# Patient Record
Sex: Male | Born: 1980
Health system: Southern US, Community
[De-identification: ages and names within clinical notes are randomized; demographics above are authoritative.]

## PROBLEM LIST (undated history)

## (undated) DIAGNOSIS — E119 Type 2 diabetes mellitus without complications: Secondary | ICD-10-CM

## (undated) DIAGNOSIS — I10 Essential (primary) hypertension: Secondary | ICD-10-CM

---

## 2009-01-10 ENCOUNTER — Inpatient Hospital Stay (HOSPITAL_COMMUNITY): Admission: EM | Admit: 2009-01-10 | Discharge: 2009-01-12 | Payer: Self-pay | Admitting: Emergency Medicine

## 2010-12-16 LAB — CBC
HCT: 41.3 % (ref 39.0–52.0)
Hemoglobin: 15.2 g/dL (ref 13.0–17.0)
MCHC: 35.1 g/dL (ref 30.0–36.0)
MCV: 81.5 fL (ref 78.0–100.0)
Platelets: 266 10*3/uL (ref 150–400)
RBC: 5.07 MIL/uL (ref 4.22–5.81)
RDW: 13.3 % (ref 11.5–15.5)
WBC: 6 10*3/uL (ref 4.0–10.5)

## 2010-12-16 LAB — GLUCOSE, CAPILLARY
Glucose-Capillary: 197 mg/dL — ABNORMAL HIGH (ref 70–99)
Glucose-Capillary: 230 mg/dL — ABNORMAL HIGH (ref 70–99)
Glucose-Capillary: 292 mg/dL — ABNORMAL HIGH (ref 70–99)
Glucose-Capillary: 297 mg/dL — ABNORMAL HIGH (ref 70–99)
Glucose-Capillary: 318 mg/dL — ABNORMAL HIGH (ref 70–99)
Glucose-Capillary: 325 mg/dL — ABNORMAL HIGH (ref 70–99)
Glucose-Capillary: 331 mg/dL — ABNORMAL HIGH (ref 70–99)
Glucose-Capillary: 340 mg/dL — ABNORMAL HIGH (ref 70–99)
Glucose-Capillary: 362 mg/dL — ABNORMAL HIGH (ref 70–99)
Glucose-Capillary: 377 mg/dL — ABNORMAL HIGH (ref 70–99)
Glucose-Capillary: 419 mg/dL — ABNORMAL HIGH (ref 70–99)
Glucose-Capillary: 509 mg/dL (ref 70–99)
Glucose-Capillary: 531 mg/dL (ref 70–99)

## 2010-12-16 LAB — BASIC METABOLIC PANEL
BUN: 8 mg/dL (ref 6–23)
CO2: 21 mEq/L (ref 19–32)
Calcium: 8.6 mg/dL (ref 8.4–10.5)
Chloride: 100 mEq/L (ref 96–112)
Creatinine, Ser: 0.84 mg/dL (ref 0.4–1.5)
Creatinine, Ser: 1 mg/dL (ref 0.4–1.5)
GFR calc Af Amer: >60 mL/min (ref >60)
GFR calc non Af Amer: >60 mL/min (ref >60)
Glucose, Bld: 324 mg/dL — ABNORMAL HIGH (ref 70–99)
Potassium: 3.8 mEq/L (ref 3.5–5.1)
Potassium: 3.8 mEq/L (ref 3.5–5.1)

## 2010-12-16 LAB — URINALYSIS, ROUTINE W REFLEX MICROSCOPIC
Leukocytes, UA: NEGATIVE
Nitrite: NEGATIVE
Specific Gravity, Urine: 1.034 — ABNORMAL HIGH (ref 1.005–1.030)
Urobilinogen, UA: 0.2 mg/dL (ref 0.0–1.0)
pH: 5.5 (ref 5.0–8.0)

## 2010-12-16 LAB — KETONES, QUALITATIVE

## 2010-12-16 LAB — DIFFERENTIAL
Basophils Absolute: 0 10*3/uL (ref 0.0–0.1)
Lymphocytes Relative: 22 % (ref 12–46)
Neutro Abs: 5.5 10*3/uL (ref 1.7–7.7)
Neutrophils Relative %: 70 % (ref 43–77)

## 2010-12-16 LAB — HEMOGLOBIN A1C: Hgb A1c MFr Bld: 10.9 % — ABNORMAL HIGH (ref 4.6–6.1)

## 2010-12-16 LAB — POCT I-STAT, CHEM 8
Glucose, Bld: 606 mg/dL (ref 70–99)
HCT: 47 % (ref 39.0–52.0)
Hemoglobin: 16 g/dL (ref 13.0–17.0)
Potassium: 4.1 mEq/L (ref 3.5–5.1)
Sodium: 127 mEq/L — ABNORMAL LOW (ref 135–145)
TCO2: 16 mmol/L (ref 0–100)

## 2010-12-16 LAB — PHOSPHORUS: Phosphorus: 3.3 mg/dL (ref 2.3–4.6)

## 2011-01-20 NOTE — H&P (Signed)
NAMEJUELZ, [name] NO.:  [id number]   MEDICAL RECORD NO.:  [id number]          PATIENT TYPE:  EMS   LOCATION:  ED                           FACILITY:  [facility]   PHYSICIAN:  Pedro Earls, MD     DATE OF BIRTH:  April 15, 1981   DATE OF ADMISSION:  01/09/2009  DATE OF DISCHARGE:                              HISTORY & PHYSICAL   CHIEF COMPLAINT:  Dizziness and polyuria.   HISTORY OF PRESENT ILLNESS:  This is [name]  patient with no significant past medical history who was presented with  11-month history of dizziness.  According to the patient, he had been  getting dizzy whenever he eats anything greasy and dizzy spells come any  time during the day.  The patient had also been eating more and had been  drinking a lot of water and had been urinating every 5 to 10 minutes.  The patient is complaining of some weakness.  Denies any nausea, any  fevers, any chills, any shortness of breath, or chest pain.   REVIEW OF SYSTEMS:  As above.  Rest of the review of systems is  negative.   PAST MEDICAL HISTORY:  None.   PAST SURGICAL HISTORY:  None.   MEDICATIONS:  None.   SOCIAL HISTORY:  Negative for smoking, alcohol, and IV drug abuse.   FAMILY HISTORY:  Positive for hypertension.   PHYSICAL EXAMINATION:  VITAL SIGNS:  Temperature 98.1, blood pressure  141/107, pulse 119 to 124, respirations 22, and pulse ox 98% on room  air.  GENERAL:  The patient is awake, alert, oriented x3.  HEENT:  Pupils are equal, round, and reactive to light.  No icterus.  No  pallor.  Extraocular movements are intact.  Oral mucosa is dry.  NECK:  Supple.  No JVD.  No lymphadenopathy.  CVS:  S1 and S2.  Regular to sinus tach.  No murmurs, heaves, or  gallops.  CHEST:  Clear.  ABDOMEN:  Soft, obese, and nontender.  Bowel sounds are present.  No  hepatosplenomegaly.  EXTREMITIES:  Peripheral pulses are present.  No clubbing, cyanosis, or  edema.  CNS:  Sensory  and motor grossly.  Cranial nerves II through XII are  intact.  SKIN:  No rashes.  MUSCULOSKELETAL:  Unremarkable.   LABORATORY DATA:  Sodium 127, creatinine 1.03, and blood sugar 606.  CBC  within normal range.  UA within normal range.   IMPRESSION:  1. New-onset diabetes.  2. Obesity.  3. Dehydration.  4. Hyponatremia.  5. Sinus tachycardia.  6. Hypertension.   PLAN:  Admit to med-surge.  Start insulin sliding scale coverage.  Start  metformin 500 mg b.i.d.  IV fluids with 150 mL per hour.  Diabetes  education.  Start lisinopril 2.5 mg daily for the patient's blood  pressure.      Pedro Earls, MD  Electronically Signed     NS/MEDQ  D:  01/10/2009  T:  01/10/2009  Job:  [id number]

## 2011-01-20 NOTE — Discharge Summary (Signed)
[name], [name] NO.:  [id number]   MEDICAL RECORD NO.:  [id number]          PATIENT TYPE:  INP   LOCATION:  1530                         FACILITY:  [facility]   PHYSICIAN:  Hillery Aldo, M.D.   DATE OF BIRTH:  March 15, 1981   DATE OF ADMISSION:  01/09/2009  DATE OF DISCHARGE:  01/12/2009                               DISCHARGE SUMMARY   PRIMARY CARE PHYSICIAN:  None.  The patient will be referred to Dr.  Dorothyann Nichols for hospital follow-up and primary care.   DISCHARGE DIAGNOSES:  1. Newly-diagnosed uncontrolled type 2 diabetes.  2. Hyponatremia.  3. Obesity.  4. Hypertension.   DISCHARGE MEDICATIONS:  1. Lisinopril 10 mg p.o. daily.  2. Lantus insulin 40 units subcutaneously q.h.s.  3. NovoLog insulin 8 units subcutaneously q.a.c.  4. Insulin resistant sliding scale q.a.c. and q.h.s.  5. Glucotrol XL 10 mg p.o. daily.  6. Metformin 1000 mg p.o. b.i.d.   CONSULTATIONS:  None.   HISTORY OF PRESENT ILLNESS:  The patient is a 30 year old obese male,  with no significant past medical history, who presented to the hospital  with a chief complaint of dizziness.  The patient reported progressive  polyuria and polydipsia, to the point where he has been urinating every  5-10 minutes.  He also felt weak.  On initial evaluation in the  emergency department, he was found to have a marked elevation in his  blood glucose and was admitted for further evaluation and workup.  For  full details, please see the dictated report done by Dr. Gratiot Nichols.   PROCEDURES/DIAGNOSTIC STUDIES:  None.   DISCHARGE LABORATORY VALUES:  Sodium was 131, potassium 3.8, chloride  102, bicarb 20, BUN 8, creatinine 0.84, glucose 324, calcium 8.6.  Hemoglobin A1c was 10.9%.  Phosphorus was 3.3, magnesium was 2.0.  White  blood cell count was 6.0, hemoglobin 14.5, hematocrit 41.3, platelets  240.   HOSPITAL COURSE:  Problem #1 -  New onset of uncontrolled type 2  diabetes:  The patient was  admitted and found to have mild serum ketosis  without evidence of metabolic acidosis.  He was felt to be in early  diabetic ketoacidosis and was vigorously hydrated and put on a  combination of oral hypoglycemics as well as sliding scale insulin.  He  was seen in consultation with the dietician and diabetes coordinators,  who educated him extensively on his disease.  His anti-diabetic regimen  was titrated up for better glycemic control.  His hemoglobin A1c was  found to be markedly elevated at 10.9%, corresponding to a mean plasma  glucose of 266.  At this time, his glycemic control is improved but  still suboptimal.  He will be discharged on the regimen as outlined  above, and has been instructed to follow up with Dr. Allyne Nichols within one  week, and to keep track of his blood glucoses so that she can further  adjust his medications.  He will also be set up for outpatient diabetes  education.  Problem #2 -  Hyponatremia:  This is felt to be artifactual, secondary  to hyperglycemia.  Problem #3 -  Obesity:  The patient was counseled on the importance of  weight loss and put on a carbohydrate modified diet.  Problem #4 -  Hypertension:  The patient's blood pressure was controlled  with up-titration of lisinopril.  Discharge blood pressure is 107/68.   DISPOSITION:  The patient is medically stable for discharge.  He has  been instructed fully on how to check his blood glucoses and how to self-  administer insulin according to a sliding scale.  The patient will  follow up with Dr. Allyne Nichols and at the outpatient [facility].   Time spent coordinating care for discharge and discharge instructions  equals 35 minutes.      Hillery Aldo, M.D.  Electronically Signed     CR/MEDQ  D:  01/12/2009  T:  01/12/2009  Job:  [id number]   cc:   Candyce Churn. [name], M.D.  Fax: [phone]

## 2011-04-17 ENCOUNTER — Inpatient Hospital Stay (HOSPITAL_COMMUNITY)
Admission: EM | Admit: 2011-04-17 | Discharge: 2011-04-20 | DRG: 639 | Disposition: A | Discharge status: Home / Self Care | Payer: Self-pay | Attending: Internal Medicine | Admitting: Internal Medicine

## 2011-04-17 DIAGNOSIS — E1310 Other specified diabetes mellitus with ketoacidosis without coma: Principal | ICD-10-CM | POA: Diagnosis present

## 2011-04-17 DIAGNOSIS — E876 Hypokalemia: Secondary | ICD-10-CM | POA: Diagnosis not present

## 2011-04-17 DIAGNOSIS — IMO0002 Reserved for concepts with insufficient information to code with codable children: Secondary | ICD-10-CM | POA: Diagnosis present

## 2011-04-17 DIAGNOSIS — R030 Elevated blood-pressure reading, without diagnosis of hypertension: Secondary | ICD-10-CM | POA: Diagnosis present

## 2011-04-17 DIAGNOSIS — R000 Tachycardia, unspecified: Secondary | ICD-10-CM | POA: Diagnosis present

## 2011-04-17 DIAGNOSIS — K036 Deposits [accretions] on teeth: Secondary | ICD-10-CM | POA: Diagnosis present

## 2011-04-17 DIAGNOSIS — K045 Chronic apical periodontitis: Secondary | ICD-10-CM | POA: Diagnosis present

## 2011-04-17 DIAGNOSIS — K047 Periapical abscess without sinus: Secondary | ICD-10-CM | POA: Diagnosis present

## 2011-04-17 DIAGNOSIS — K029 Dental caries, unspecified: Secondary | ICD-10-CM | POA: Diagnosis present

## 2011-04-17 LAB — DIFFERENTIAL
Eosinophils Absolute: 0.1 10*3/uL (ref 0.0–0.7)
Eosinophils Relative: 1 % (ref 0–5)
Lymphocytes Relative: 28 % (ref 12–46)
Lymphs Abs: 2 10*3/uL (ref 0.7–4.0)
Monocytes Absolute: 0.5 10*3/uL (ref 0.1–1.0)

## 2011-04-17 LAB — POCT I-STAT, CHEM 8
Calcium, Ion: 1.24 mmol/L (ref 1.12–1.32)
Chloride: 106 mEq/L (ref 96–112)
Creatinine, Ser: 1 mg/dL (ref 0.50–1.35)
Glucose, Bld: 480 mg/dL — ABNORMAL HIGH (ref 70–99)
Hemoglobin: 16 g/dL (ref 13.0–17.0)
Potassium: 4.6 mEq/L (ref 3.5–5.1)

## 2011-04-17 LAB — POCT I-STAT 3, ART BLOOD GAS (G3+)
Acid-base deficit: 13 mmol/L — ABNORMAL HIGH (ref 0.0–2.0)
O2 Saturation: 98 %
TCO2: 11 mmol/L (ref 0–100)
pCO2 arterial: 20.7 mmHg — ABNORMAL LOW (ref 35.0–45.0)

## 2011-04-17 LAB — CBC
HCT: 43.2 % (ref 39.0–52.0)
MCHC: 35.6 g/dL (ref 30.0–36.0)
MCV: 77.4 fL — ABNORMAL LOW (ref 78.0–100.0)
Platelets: 233 10*3/uL (ref 150–400)
RDW: 13 % (ref 11.5–15.5)

## 2011-04-18 ENCOUNTER — Inpatient Hospital Stay (HOSPITAL_COMMUNITY): Payer: Self-pay

## 2011-04-18 LAB — DIFFERENTIAL
Eosinophils Absolute: 0.1 10*3/uL (ref 0.0–0.7)
Eosinophils Relative: 2 % (ref 0–5)
Lymphs Abs: 1.6 10*3/uL (ref 0.7–4.0)
Monocytes Absolute: 0.5 10*3/uL (ref 0.1–1.0)

## 2011-04-18 LAB — BASIC METABOLIC PANEL
Calcium: 8.4 mg/dL (ref 8.4–10.5)
Chloride: 101 mEq/L (ref 96–112)
Creatinine, Ser: 0.89 mg/dL (ref 0.50–1.35)
GFR calc Af Amer: >60 mL/min (ref >60)
GFR calc non Af Amer: >60 mL/min (ref >60)
Glucose, Bld: 317 mg/dL — ABNORMAL HIGH (ref 70–99)
Potassium: 3.7 mEq/L (ref 3.5–5.1)
Sodium: 135 mEq/L (ref 135–145)
Sodium: 130 mEq/L — ABNORMAL LOW (ref 135–145)

## 2011-04-18 LAB — PHOSPHORUS
Phosphorus: 2.2 mg/dL — ABNORMAL LOW (ref 2.3–4.6)
Phosphorus: 2.1 mg/dL — ABNORMAL LOW (ref 2.3–4.6)

## 2011-04-18 LAB — GLUCOSE, CAPILLARY
Glucose-Capillary: 361 mg/dL — ABNORMAL HIGH (ref 70–99)
Glucose-Capillary: 295 mg/dL — ABNORMAL HIGH (ref 70–99)
Glucose-Capillary: 205 mg/dL — ABNORMAL HIGH (ref 70–99)
Glucose-Capillary: 248 mg/dL — ABNORMAL HIGH (ref 70–99)
Glucose-Capillary: 318 mg/dL — ABNORMAL HIGH (ref 70–99)
Glucose-Capillary: 228 mg/dL — ABNORMAL HIGH (ref 70–99)

## 2011-04-18 LAB — CBC
HCT: 36.8 % — ABNORMAL LOW (ref 39.0–52.0)
MCHC: 36.1 g/dL — ABNORMAL HIGH (ref 30.0–36.0)
MCV: 76.8 fL — ABNORMAL LOW (ref 78.0–100.0)
Platelets: 193 10*3/uL (ref 150–400)
RDW: 13.1 % (ref 11.5–15.5)
WBC: 5.1 10*3/uL (ref 4.0–10.5)

## 2011-04-18 LAB — HEMOGLOBIN A1C: Hgb A1c MFr Bld: 14.6 % — ABNORMAL HIGH (ref <5.7)

## 2011-04-18 LAB — MAGNESIUM: Magnesium: 1.8 mg/dL (ref 1.5–2.5)

## 2011-04-18 NOTE — H&P (Signed)
NAMEJOSH, NICOLOSI NO.:  [id number]  MEDICAL RECORD NO.:  [id number]  LOCATION:  [facility]                         FACILITY:  [facility]  PHYSICIAN:  Della Goo, M.D. DATE OF BIRTH:  1981/05/22  DATE OF ADMISSION:  04/18/2011 DATE OF DISCHARGE:                             HISTORY & PHYSICAL   PRIMARY CARE PHYSICIAN:  None.  CHIEF COMPLAINT:  Increased urination and toothache,  HISTORY OF PRESENT ILLNESS:  This is a 30 year old male with a history of type 2 diabetes which had been diagnosed 2 years ago who had successfully lost some weight and changed diet and had improvement in his blood sugars who reports going off his medications eventually.  He presents to the emergency department tonight after 1 week of polyuria and polydipsia and a toothache.  He denies having any fevers, chills, chest pain, or shortness of breath.  The patient was seen in the emergency department and his blood sugar was found to be 480 and he was also found to be in diabetic ketoacidosis with CO2 of 11 and a bicarbonate level of 10.4 on a arterial blood gas.  The pH on the venous blood gas was 7.309, which was mildly acidotic.  The patient was started on the IV insulin drip.  He was also placed on antibiotic therapy IV with clindamycin for the dental abscess.  The patient was referred for medical admission.  PAST MEDICAL HISTORY:  Type 2 diabetes mellitus, Morbid Obesity.  PAST SURGICAL HISTORY:  None.  MEDICATIONS:  None.  ALLERGIES:  No known drug allergies.  SOCIAL HISTORY:  The patient is married.  He is a nonsmoker, nondrinker. No history of illicit drug usage.  FAMILY HISTORY:  Positive for diabetes in his mother.  REVIEW OF SYSTEMS:  Pertinent as mentioned above.  PHYSICAL EXAMINATION FINDINGS:  This is a morbidly obese 30 year old [place] American male who is in no acute distress currently. VITAL SIGNS:  Temperature 98.4, blood pressure 150/102 initially, heart rate  122 initially, respirations 18, and O2 saturations 99%. HEENT:  Normocephalic and atraumatic.  Pupils are equally round and reactive to light.  Extraocular movements are intact.  Funduscopic benign.  There is no scleral icterus.  Nares are patent bilaterally. Oropharynx is clear. NECK:  Supple and full range of motion.  No thyromegaly, adenopathy, or jugular venous distention. CARDIOVASCULAR:  Regular rate and rhythm.  No murmurs, gallops, or rubs appreciated. LUNGS:  Clear to auscultation bilaterally.  No rales, rhonchi, or wheezes. CHEST:  Chest wall excursion is symmetric and breathing is unlabored. ABDOMEN:  Obese, positive bowel sounds, soft, nontender, nondistended. No hepatosplenomegaly. EXTREMITIES:  Without cyanosis, clubbing, or edema. NEUROLOGIC:  Nonfocal.  LABORATORY STUDIES:  White blood cell count 7.2, hemoglobin 15.4, hematocrit 43.2, MCV 77.4, platelets 233, neutrophils 63%, lymphocytes 28%.  Sodium 133, potassium 4.6, chloride 106, CO2 of 11, BUN 10, creatinine 1.00, and glucose 480.  Arterial blood gas with a pH of 7.309, pCO2 of 20.7, pO2 of 112.0, bicarb 10.4, and O2 saturation 98%.  ASSESSMENT:  This is a 30 year old male is being admitted with: 1. Diabetic ketoacidosis. 2. Dental abscess. 3. Elevated blood pressure with no previous history of hypertension. 4.  Morbid obesity. 5. Tachycardia.  PLAN:  The patient will be admitted to telemetry area for monitoring. He has been placed on the IV insulin drip protocol and IV fluids have also been ordered per the protocol.  Fluid resuscitation will continue and his electrolytes will be monitored along with his blood sugars per the protocol.  The patient's electrolytes will be corrected as needed and the electrolyte checks will be q.4 h. x4.  The patient will be transitioned to sliding-scale insulin coverage and transitioned to insulin therapy as well.  The IV antibiotic therapy with clindamycin will continue for  the dental abscess and pain control therapy has also been ordered.  The patient has been placed on non-caloric clears for a diet while he is on the IV insulin drip.  This will be transitioned to a carb-modified medium diet once he is off the insulin drip.  Lovenox therapy has been ordered for DVT prophylaxis and further workup will ensue pending results of the patient's clinical course.     Della Goo, M.D.     HJ/MEDQ  D:  04/18/2011  T:  04/18/2011  Job:  [id number]  Electronically Signed by Della Goo M.D. on 04/18/2011 09:35:39 PM

## 2011-04-19 LAB — GLUCOSE, CAPILLARY
Glucose-Capillary: 324 mg/dL — ABNORMAL HIGH (ref 70–99)
Glucose-Capillary: 272 mg/dL — ABNORMAL HIGH (ref 70–99)
Glucose-Capillary: 322 mg/dL — ABNORMAL HIGH (ref 70–99)
Glucose-Capillary: 336 mg/dL — ABNORMAL HIGH (ref 70–99)
Glucose-Capillary: 391 mg/dL — ABNORMAL HIGH (ref 70–99)

## 2011-04-20 DIAGNOSIS — K029 Dental caries, unspecified: Secondary | ICD-10-CM

## 2011-04-20 DIAGNOSIS — K047 Periapical abscess without sinus: Secondary | ICD-10-CM

## 2011-04-20 DIAGNOSIS — K083 Retained dental root: Secondary | ICD-10-CM

## 2011-04-20 DIAGNOSIS — K0401 Reversible pulpitis: Secondary | ICD-10-CM

## 2011-04-20 LAB — BASIC METABOLIC PANEL
BUN: 6 mg/dL (ref 6–23)
Chloride: 100 mEq/L (ref 96–112)
GFR calc Af Amer: >60 mL/min (ref >60)
GFR calc non Af Amer: >60 mL/min (ref >60)
Potassium: 3.2 mEq/L — ABNORMAL LOW (ref 3.5–5.1)
Sodium: 135 mEq/L (ref 135–145)

## 2011-04-20 LAB — CBC
HCT: 36.9 % — ABNORMAL LOW (ref 39.0–52.0)
MCHC: 33.9 g/dL (ref 30.0–36.0)
Platelets: 195 10*3/uL (ref 150–400)
RDW: 13.3 % (ref 11.5–15.5)
WBC: 3.5 10*3/uL — ABNORMAL LOW (ref 4.0–10.5)

## 2011-04-20 LAB — GLUCOSE, CAPILLARY
Glucose-Capillary: 291 mg/dL — ABNORMAL HIGH (ref 70–99)
Glucose-Capillary: 407 mg/dL — ABNORMAL HIGH (ref 70–99)
Glucose-Capillary: 352 mg/dL — ABNORMAL HIGH (ref 70–99)

## 2011-04-21 NOTE — Discharge Summary (Signed)
  [name], [name] NO.:  [id number]  MEDICAL RECORD NO.:  [id number]  LOCATION:  5504                         FACILITY:  [facility]  PHYSICIAN:  Lonia Blood, M.D.       DATE OF BIRTH:  02/07/1981  DATE OF ADMISSION:  04/17/2011 DATE OF DISCHARGE:  04/20/2011                              DISCHARGE SUMMARY   PRIMARY CARE PHYSICIAN:  [facility].  DISCHARGE DIAGNOSES: 1. Extensive caries with left lower first and second molar having     significant periapical abscesses - the patient is scheduled for     extractions and debridement by Dr. Kristin Bruins on April 23, 2011. 2. Diabetic ketoacidosis. 3. Morbid obesity. 4. Hypokalemia. 5. Uncontrolled diabetes mellitus, probably type 2 but now insulin     requiring as the patient presented with severe acidosis.  DISCHARGE MEDICATIONS: 1. Insulin 70/30 50 units twice a day. 2. Augmentin 875 mg by mouth twice a day for 1 week. 3. Vitamin B12 injections once a month. 4. Metformin 500 mg twice a day.  CONDITION ON DISCHARGE:  The patient was discharged in good condition.  Temperature 98.5, heart rate 92, respirations 20, blood pressure 120/84, saturation 100% on room air.  He was scheduled a follow up at [facility] early in September with Dr. Georganna Skeans.  He will also follow up with Dr. Kristin Bruins from dentistry on April 23, 2011, for dental extractions.  HISTORY AND PHYSICAL:  Refer dictated H and P done by Dr. Georgann Housekeeper COURSE:  [name] is a 30 year old gentleman with known diabetes presents to the emergency room with increased urination and pill take.  He was found to be acidotic with glucose levels into the 480.  His measured hemoglobin A1c came back at 14.6.  The patient was started on intravenous insulin as well as intravenous fluids.  He was started on intravenous clindamycin for his tooth abscess.  By hospital day #2, his acidosis has corrected.  He was able  to tolerate the regular diet.  He was transitioned with subcutaneous insulin using Lantus and sliding scale NovoLog.  Due to the cost issues, the patient will be going home on insulin 70/30 generic so he can afford it.  After 1 day of clindamycin, the patient continued to experience significant swelling of his face.  Orthopantogram indicated apical abscesses.  He was switched to oral Augmentin with improvement in symptoms by the time of discharge.  Prior to discharge the patient was examined by Dr. Cindra Eves from dentistry and he is scheduled for mouth extractions on Thursday April 23, 2011.     Lonia Blood, M.D.     SL/MEDQ  D:  04/20/2011  T:  04/21/2011  Job:  [id number]  Electronically Signed by Lonia Blood M.D. on 04/21/2011 05:46:49 PM

## 2011-04-22 NOTE — Consult Note (Signed)
NAMEMATVEY, LLANAS NO.:  [id number]  MEDICAL RECORD NO.:  [id number]  LOCATION:  5504                         FACILITY:  [facility]  PHYSICIAN:  Cindra Eves, D.D.S.DATE OF BIRTH:  [birth year]  DATE OF CONSULTATION:  04/20/2011 DATE OF DISCHARGE:  04/20/2011                                CONSULTATION   [name] is a 30 year old male referred by Dr. Lonia Blood for dental consultation.  The patient was recently admitted with a history of diabetic ketoacidosis and left facial swelling.  Dental consultation was requested to evaluate the patient and provide treatment as indicated.  MEDICAL HISTORY: 1. Diabetes mellitus - type 2.     a.     History of diabetic ketoacidosis and reason for this      admission.     b.     Current insulin therapy as per Medical Team. 2. Morbid obesity. 3. History of left facial swelling with previous clindamycin IV     antibiotic therapy that was then switched to Augmentin 875 mg    twice daily.  ALLERGIES:  None known.  MEDICATIONS: 1. Augmentin 875 mg twice daily. 2. Lovenox 80 mg subcutaneously every 24 hours. 3. NovoLog insulin per sliding scale. 4. Lantus insulin 10 units twice daily. 5. K-Phos Neutral 250 mg 3 times daily.  SOCIAL HISTORY:  The patient is married.  The patient has no children. The patient is nonsmoker, nondrinker.  The patient denies use of illicit drugs.  FAMILY HISTORY:  Mother and father are both alive.  Father is healthy. Mother with a history of hypertension.  Aunt with a history of diabetes mellitus.  FUNCTIONAL ASSESSMENT:  The patient was independent for ADLs prior to this admission.  REVIEW OF SYSTEMS:  This was reviewed from the chart for this admission.  DENTAL HISTORY:  CHIEF COMPLAINT:  Dental consultation was requested to evaluate left some facial swelling.  HISTORY OF PRESENT ILLNESS:  The patient gives a history of having a gumboil arise on the lower left quadrant  approximately 2 weeks ago.  The pain and swelling continued to worsen along with other symptoms.  The patient subsequently presented to the emergency room and was admitted with DKA and left facial swelling.  The patient was subsequently placed on IV antibiotic therapy with clindamycin 600 mg as indicated.  The patient then switched recently to Augmentin 875 mg twice daily orally. The patient indicates that he last saw dentist when he was 30 years of age.  This was 14 years ago.  The patient denies having regular dental care.  The patient is suffering from oral neglect.  DENTAL EXAMINATION:  GENERAL:  The patient is a well-developed, obese male in no acute distress. VITAL SIGNS:  Blood pressure is 121/66, pulse rate is 75, respirations are 19, and temperature is 97.8. HEAD/NECK:  The patient with left neck lymphadenopathy.  The patient also has some left facial swelling.  The patient denies acute TMJ symptoms.  The patient denies symptoms of trismus. INTRAORAL.  The patient has a normal saliva.  The patient does have a buccal abscess in the area of tooth numbers 18 and 19. DENTITION:  The patient is not  missing any teeth.  The patient does have impacted wisdom teeth numbers 1, 16, and 17.  The patient has multiple retained root segments. PERIODONTAL:  The patient with chronic periodontitis and plaque calculus accumulations, selective areas of gingival recession, and selective areas of tooth mobility. DENTAL CARIES:  The patient has multiple dental caries noted.  The patient would need a full series of dental radiographs in the future to identify the full extent of the dental caries. ENDODONTIC:  The patient with a history of acute pulpitis symptoms.  The patient also has multiple areas of periapical pathology and radiolucency.  There is a buccal abscess in the area of tooth numbers 18 and 19 along with the periapical pathology. CROWN/BRIDGE:  There are no crown or bridge  restorations. PROSTHODONTIC:  The patient without a history of partial dentures. OCCLUSION:  The patient with a poor occlusal scheme secondary to end-to- end occlusion, multiple retained root segments, multiple impacted teeth, multiple supererupted and drifted teeth and several of which are in malocclusion positions.  RADIOGRAPHIC INTERPRETATION:  An ortho panoramic was taken.  There are no missing teeth.  There are impacted tooth numbers 1, 16, and 17.  There are multiple retained root segments.  There are extensive dental caries.  There are multiple areas of periapical pathology and radiolucency associated with tooth numbers 18, 19, and 30.  There is supereruption and drifting of the unopposed teeth into the edentulous areas.  ASSESSMENT: 1. History of buccal abscess in the area of tooth numbers 18 and 19 on     the lower left quadrant. 2. Periapical pathology and radiolucency. 3. Chronic apical periodontitis. 4. Chronic periodontitis. 5. Plaque and calculus accumulations. 6. Selective areas of gingival recession. 7. Incipient tooth mobility. 8. Multiple malpositioned teeth. 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 10.Poor occlusal scheme. 11.History of oral neglect. 12.History of current Lovenox therapy with a risk for bleeding with     invasive dental procedures.  PLAN/RECOMMENDATIONS:   1. I discussed the risks, benefits, and complications of various treatment options with the patient in relationship to his medical and dental conditions.  We discussed various treatment options to include no treatment, multiple extractions with alveoloplasty, referral to an oral surgeon for removal of infected and  impacted teeth as indicated, periodontal therapy, dental restorations,  root canal therapy, crown or bridge therapy, implant therapy, and replacing missing teeth as indicated.  We also discussed followup with a primary dentist of his choice for an exam, x-rays, and overall discussion of  other treatment options at this time.  The patient currently wishes to proceed with extraction of the indicated teeth with alveoloplasty  and gross debridement of remaining dentition.  This will be performed in the operating room as an outpatient.  This has been scheduled for Thursday, April 23, 2011, at 7:30 a.m. through the [employer]. The patient will be contacted by short stay personnel and presurgical testing to schedule preop visit, obtain labs as indicated and discussed the overall plan of care.  2. I discussed the findings with Dr. Lonia Blood concerning the ability of the patient to proceed with procedures as planned.  Dr. Lavera Guise has agreed to discharge the patient and have his dental treatment provided as an outpatient in the operating room in [facility] through the Short Stay.  He will prescribe appropriate antibiotic therapy, most likely utilizing Augmentin 875 mg twice daily until this surgery can be performed and for an additional 2-3 days afterwards.  ______________________________ Cindra Eves, D.D.S.     RK/MEDQ  D:  04/20/2011  T:  04/21/2011  Job:  [id number]  cc:   Lonia Blood, M.D.  Electronically Signed by Cindra Eves D.D.S. on 04/22/2011 08:58:44 AM

## 2011-04-23 ENCOUNTER — Ambulatory Visit (HOSPITAL_COMMUNITY)
Admission: RE | Admit: 2011-04-23 | Discharge: 2011-04-23 | Disposition: A | Discharge status: Home / Self Care | Payer: Self-pay | Source: Ambulatory Visit | Attending: Dentistry | Admitting: Dentistry

## 2011-04-23 DIAGNOSIS — K036 Deposits [accretions] on teeth: Secondary | ICD-10-CM | POA: Insufficient documentation

## 2011-04-23 DIAGNOSIS — R220 Localized swelling, mass and lump, head: Secondary | ICD-10-CM | POA: Insufficient documentation

## 2011-04-23 DIAGNOSIS — K0530 Chronic periodontitis, unspecified: Secondary | ICD-10-CM | POA: Insufficient documentation

## 2011-04-23 DIAGNOSIS — K083 Retained dental root: Secondary | ICD-10-CM

## 2011-04-23 DIAGNOSIS — K045 Chronic apical periodontitis: Secondary | ICD-10-CM

## 2011-04-23 DIAGNOSIS — K047 Periapical abscess without sinus: Secondary | ICD-10-CM | POA: Insufficient documentation

## 2011-04-23 DIAGNOSIS — E119 Type 2 diabetes mellitus without complications: Secondary | ICD-10-CM | POA: Insufficient documentation

## 2011-04-23 LAB — GLUCOSE, CAPILLARY
Glucose-Capillary: 284 mg/dL — ABNORMAL HIGH (ref 70–99)
Glucose-Capillary: 304 mg/dL — ABNORMAL HIGH (ref 70–99)

## 2011-04-23 LAB — POCT I-STAT 4, (NA,K, GLUC, HGB,HCT)
Hemoglobin: 13.3 g/dL (ref 13.0–17.0)
Potassium: 3.7 mEq/L (ref 3.5–5.1)

## 2011-04-23 NOTE — Op Note (Signed)
NAMETYVION, EDMONDSON NO.:  [id number]  MEDICAL RECORD NO.:  [id number]  LOCATION:  [facility]                         FACILITY:  [facility]  PHYSICIAN:  Cindra Eves, D.D.S.DATE OF BIRTH:  May 10, 1981  DATE OF PROCEDURE:  04/23/2011 DATE OF DISCHARGE:  04/23/2011                              OPERATIVE REPORT   PREOPERATIVE DIAGNOSES: 1. Diabetes mellitus - type 2. 2. History of left facial swelling. 3. Apical periodontitis. 4. Periapical abscess, lower left quadrant. 5. Multiple retained root segments. 6. Chronic periodontitis. 7. Accretions.  POSTOPERATIVE DIAGNOSES: 1. Diabetes mellitus - type 2. 2. History of left facial swelling. 3. Apical periodontitis. 4. Periapical abscess, lower left quadrant. 5. Multiple retained root segments. 6. Chronic periodontitis. 7. Accretions.  OPERATIONS: 1. Extraction of tooth #3, #14, #15, #18, #19, and #30. 2. Four quadrants of alveoloplasty. 3. Gross debridement of the remaining dentition.  SURGEON:  Cindra Eves, DDS.  ASSISTANT:  Public house manager (Sales executive).  ANESTHESIA:  General anesthesia via oral endotracheal tube.  PREMEDICATION: 1. Ancef 2 grams IV prior to invasive dental procedures. 2. Local anesthesia with a total utilization of five carpules each     containing 34 mg of lidocaine with 0.017 mg of epinephrine as well     as two carpules each containing 9 mg of bupivacaine with 0.009 mg     of epinephrine.  SPECIMENS:  There were six teeth that were discarded.  DRAINS:  None.  CULTURES:  None.  COMPLICATIONS:  None.  ESTIMATED BLOOD LOSS:  100 mL.  FLUIDS:  1600 mL of lactated Ringer solution.  INDICATIONS:  The patient was recently diagnosed with diabetic ketoacidosis associated with his diabetes mellitus.  The patient subsequent developed toothache symptoms and left facial swelling. Dental consultation was requested to rule out dental infection that may affect this patient's  systemic health.  The patient was examined and treatment planned for multiple extractions with alveoloplasty and gross debridement of the remaining dentition.  This treatment plan was formulated as above.  OPERATIVE FINDINGS:  The patient was examined in operating room #4 in the neurosurgery suite.  The teeth were identified for extraction.  The patient noted be affected by mandibular left buccal abscess, apical periodontitis, multiple retained root segments, extensive dental caries, chronic periodontitis, and the presence of significant accretions.  The aforementioned necessitated removal of multiple teeth with alveoloplasty and gross debridement of the remaining dentition..  DESCRIPTION OF PROCEDURE:  The patient brought to the neurosurgery operating room #4.  The patient was then placed in supine position on the operating room table.  General anesthesia was then induced via an oral endotracheal tube.  The patient was then prepped and draped in usual manner for dental medicine procedure.  Time-out was performed. The patient identified.  Procedures were verified.  A throat pack was placed at this time.  The oral cavity was then thoroughly examined and the findings are noted above.  The patient was then ready for the dental medicine procedure as follows:  Local anesthesia was administered sequentially with a total utilization of five carpules each containing 34 mg of lidocaine with 0.017 mg of epinephrine as well as two carpules each containing 9  mg of bupivacaine with 0.009 mg of epinephrine.  The maxillary left and mandibular left quadrants first approached. Anesthesia was delivered via infiltration utilizing lidocaine with epinephrine.  An inferior alveolar nerve block and long buccal nerve block were then given utilizing the bupivacaine with epinephrine. Further infiltration was achieved utilizing lidocaine with epinephrine.  At this point in time, the maxillary left quadrant  was approached.  A 15 blade incision was made from the distal #15 extended to the mesial #12 surgical flap was then carefully reflected.  Appropriate amounts of buccal and interseptal bone were removed around tooth #14 and #15 appropriately.  The teeth were then subluxated with a series straight elevators.  At this point time, a surgical handpiece and bur were used to section the roots from the buccal to the lingual and from the mesial to the distal appropriately.  The roots were then elevated selectively. The roots were then removed without complication utilizing a series of elevators as well as a rongeurs.  Alveoplasty was then performed utilizing rongeurs and bone file.  The surgical site was then irrigated with copious amounts of sterile saline.  The surgical site was then closed utilizing a series of four interrupted sutures.  Two interproximal  sutures were then placed between tooth numbers #12 and #13 as well as #11 and #12 to further close the surgical site.    At this point in time, the surgeon's attention was drawn to the mandibular left quadrant.  A 15 blade incision was then made from the distal #18, extended to the mesial of #20, surgical flap was then carefully reflected.  Appropriate amouths of buccal and interseptal bone were removed with a surgical handpiece and bur, and copious amounts of sterile saline.  Teeth were then subluxated with a series straight elevators. Surgical handpiece and bur were then used to section the roots from the mesial to the distal.  The roots were then elevated out with a series of Cryer elevators without complications.  Alveoplasty was then performed utilizing rongeurs and bone file.  The surgical site was then irrigated with copious amounts of sterile saline.  Tissues were approximated and trimmed appropriately.  Surgical site was then closed from the mesial of #70 extended to the distal #20 utilizing 3-0 chromic gut suture in continuous  septal suture technique x1.  At this point in time, gross debridement procedure was performed with a sonic scaler to the maxillary left and mandibular left quadrant teeth appropriately.  At this point in time, the maxillary right and mandibular right quadrants were approached.  The patient given infiltration utilizing the lidocaine with epinephrine, an inferior alveolar nerve block and long buccal nerve blocks to the mandibular right quadrant appropriately utilizing the bupivacaine with epinephrine.  Further infiltration was then achieved utilizing lidocaine with epinephrine.  At this point in time, tooth #3 was approached, 15 blade incision was made on the buccal and lingual aspects appropriately, flap was then reflected, appropriate amounts of buccal bone were then removed around the retained roots associated with tooth #3.  The bone was removed with surgical handpiece, bur, and copious amounts of sterile saline.  The roots were then elevated out with a series of straight elevators.  Final removal was made with a 150 forceps without complications.  Alveoplasty was then performed utilizing rongeurs and bone file.  Surgical site was then irrigated with copious amounts of sterile saline.  Surgical site was then closed with a 3-0 chromic gut suture material in a figure-of-eight technique.  At this point in time, the mandibular right quadrants were approached, 15 blade incision was made from the mesial #31 extended to the mesial #29, surgical flap was then carefully reflected.  Buccal bone was then removed with a surgical handpiece and copious amounts of sterile saline on the buccal aspect of tooth number #30.  The coronal aspect was then removed with a 151 forceps leaving the roots remaining. The roots were then sectioned from the lingual to the buccal appropriately utilizing surgical handpiece and bur and copious amounts of sterile saline.  The roots were then elevated out with a  series of Cryer elevators without complications.  Alveoplasty was then performed utilizing rongeurs and bone file.  Surgical site was then irrigated with copious amounts of sterile saline.  Surgical site was then closed utilizing figure-of-eight suture technique in the area of #30.  At this point in time, gross debridement procedure was performed to the maxillary right and mandibular right quadrants utilizing the sonic scaler.  A series of hand curettes were utilized to finalize the removal of accretions as indicated.    At this point in time, the entire mouth was irrigated with copious amounts of sterile saline.  A series of 4x4 gauze were placed in the left side of the mouth to aid hemostasis.  Throat packs were removed at this time.  The patient was handed over to the Anesthesia team for final disposition. After appropriate amount of time, the patient was extubated and taken to the post anesthesia care unit with stable vital signs and good oxygenation level.  All counts were correct for dental medicine procedure.  The patient is seen approximately 7-10 days for evaluation for suture removal.  The patient is to continue on his Augmentin therapy twice daily until complete.  The patient will be given appropriate pain medication.  The patient understands that he will need to follow up with the general dentist for exam, dental x-rays, and overall discussion of other treatment needs as he does have extensive dental caries remaining as well as the impacted teeth.  The patient will need the referral to oral surgeon for the removal of the impacted teeth appropriately.          ______________________________ Cindra Eves, D.D.S.     RK/MEDQ  D:  04/23/2011  T:  04/23/2011  Job:  [id number]  cc:   Lonia Blood, M.D.  Electronically Signed by Cindra Eves D.D.S. on 04/23/2011 04:09:29 PM

## 2011-07-02 ENCOUNTER — Other Ambulatory Visit: Payer: Self-pay | Admitting: Infectious Diseases

## 2011-07-02 ENCOUNTER — Ambulatory Visit
Admission: RE | Admit: 2011-07-02 | Discharge: 2011-07-02 | Disposition: A | Discharge status: Home / Self Care | Payer: Self-pay | Source: Ambulatory Visit | Attending: Infectious Diseases | Admitting: Infectious Diseases

## 2011-07-02 DIAGNOSIS — R7611 Nonspecific reaction to tuberculin skin test without active tuberculosis: Secondary | ICD-10-CM

## 2018-04-11 ENCOUNTER — Encounter (HOSPITAL_COMMUNITY): Payer: Self-pay | Admitting: Emergency Medicine

## 2018-04-11 ENCOUNTER — Ambulatory Visit (HOSPITAL_COMMUNITY)
Admission: EM | Admit: 2018-04-11 | Discharge: 2018-04-11 | Disposition: A | Discharge status: Home / Self Care | Payer: Self-pay | Attending: Family Medicine | Admitting: Family Medicine

## 2018-04-11 DIAGNOSIS — L02214 Cutaneous abscess of groin: Secondary | ICD-10-CM

## 2018-04-11 DIAGNOSIS — L0291 Cutaneous abscess, unspecified: Secondary | ICD-10-CM

## 2018-04-11 DIAGNOSIS — L03314 Cellulitis of groin: Secondary | ICD-10-CM

## 2018-04-11 HISTORY — DX: Type 2 diabetes mellitus without complications: E11.9

## 2018-04-11 MED ORDER — HYDROCODONE-ACETAMINOPHEN 5-325 MG PO TABS: 1.0000 | ORAL_TABLET | Freq: Four times a day (QID) | ORAL | 0 refills | Status: DC | PRN

## 2018-04-11 MED ORDER — LIDOCAINE HCL (PF) 2 % IJ SOLN
INTRAMUSCULAR | Status: AC
  Filled 2018-04-11: qty 2

## 2018-04-11 MED ORDER — IBUPROFEN 800 MG PO TABS
800.0000 mg | ORAL_TABLET | Freq: Three times a day (TID) | ORAL | 0 refills | Status: DC
Start: 2018-04-11 — End: 2019-01-22

## 2018-04-11 MED ORDER — SULFAMETHOXAZOLE-TRIMETHOPRIM 800-160 MG PO TABS: 1.0000 | ORAL_TABLET | Freq: Two times a day (BID) | ORAL | 0 refills | Status: AC

## 2018-04-11 MED FILL — SULFAMETHOXAZOLE-TMP DS TAB: 800-160 | 10 days supply | Qty: 20 | Fill #0

## 2018-04-11 MED FILL — HYDROCODON-APAP 5-325: 5-325 | 1 days supply | Qty: 5 | Fill #0

## 2018-04-11 MED FILL — IBUPROFEN 800 MG TAB: 800 | 7 days supply | Qty: 21 | Fill #0

## 2018-04-11 NOTE — Discharge Instructions (Signed)
Keep this dressing in place until follow up tomorrow. Warm compresses.  Please start first dose of antibiotic now.  Ibuprofen for pain and Hydrocodone for breakthrough pain. May cause drowsiness. Please do not take if driving or drinking alcohol.   Please return tomorrow for repeat evaluation and dressing change.

## 2018-04-11 NOTE — ED Provider Notes (Signed)
[facility]    CSN: [id number] Arrival date & time: 04/11/18  [id number]     History   Chief Complaint Chief Complaint  Patient presents with  . Abscess    HPI [name] is a 37 y.o. male.   [name] presents with complaints of abscess to right side of pubic area. States he shaved his pubic hair last week, approximately 5-6 days ago. Then three days ago a "bump" developed, which has significantly increased in size and pain. S/O states she tried squeezing it yesterday and only had small amount of clear drainage. Otherwise has not been draining. Felt feverish yesterday, this feels improved today. Decreased appetite. Took two doses of cephalexin yesterday which S/O had given him. Denies any previous similar. No known MRSA history. Does have DM and is on insulin.    ROS per HPI.      Past Medical History:  Diagnosis Date  . Diabetes mellitus without complication (HCC)     There are no active problems to display for this patient.   History reviewed. No pertinent surgical history.     Home Medications    Prior to Admission medications   Medication Sig Start Date End Date Taking? Authorizing Provider  insulin aspart (NOVOLOG) cartridge Inject into the skin 3 (three) times daily with meals.   Yes [provider]  HYDROcodone-acetaminophen (NORCO/VICODIN) 5-325 MG tablet Take 1 tablet by mouth every 6 (six) hours as needed for severe pain. 04/11/18   Georgetta HaberBurky, Lourdes Manning B, NP  ibuprofen (ADVIL,MOTRIN) 800 MG tablet Take 1 tablet (800 mg total) by mouth 3 (three) times daily. 04/11/18   Georgetta HaberBurky, Keisuke Hollabaugh B, NP  sulfamethoxazole-trimethoprim (BACTRIM DS) 800-160 MG tablet Take 1 tablet by mouth 2 (two) times daily for 10 days. 04/11/18 04/21/18  Georgetta HaberBurky, Happy Ky B, NP    Family History Family History  Family history unknown: Yes    Social History Social History   Tobacco Use  . Smoking status: Never Smoker  Substance Use Topics  . Alcohol use: Yes  . Drug use: Yes    Types: Marijuana     Allergies   Patient has no known allergies.   Review of Systems Review of Systems   Physical Exam Triage Vital Signs ED Triage Vitals [04/11/18 1004]  Enc Vitals Group     BP (!) 164/90     Pulse Rate (!) 120     Resp 18     Temp 99 F (37.2 C)     Temp Source Oral     SpO2 100 %     Weight      Height      Head Circumference      Peak Flow      Pain Score      Pain Loc      Pain Edu?      Excl. in GC?    No data found.  Updated Vital Signs BP (!) 164/90 (BP Location: Left Arm)   Pulse (!) 120   Temp 99 F (37.2 C) (Oral)   Resp 18   SpO2 100%   Visual Acuity Right Eye Distance:   Left Eye Distance:   Bilateral Distance:    Right Eye Near:   Left Eye Near:    Bilateral Near:     Physical Exam  Constitutional: He is oriented to person, place, and time. He appears well-developed and well-nourished.  Cardiovascular: Regular rhythm. Tachycardia present.  Pulmonary/Chest: Effort normal and breath sounds normal.  Genitourinary:  Genitourinary Comments: Right pubis with area of approximately 7cm in total diameter of swelling, firmness, tenderness with fluctuance at center of approximately 2 cm; no active drainage   Neurological: He is alert and oriented to person, place, and time.  Skin: Skin is warm and dry.     UC Treatments / Results  Labs (all labs ordered are listed, but only abnormal results are displayed) Labs Reviewed - No data to display  EKG None  Radiology No results found.  Procedures Incision and Drainage Date/Time: 04/11/2018 11:03 AM Performed by: Georgetta Haber, NP Authorized by: Mardella Layman, MD   Consent:    Consent obtained:  Verbal   Consent given by:  Patient   Risks discussed:  Incomplete drainage, pain, infection and bleeding   Alternatives discussed:  No treatment, delayed treatment, observation, referral and alternative treatment Location:    Type:  Abscess   Size:  3cm   Location:   Anogenital   Anogenital location: pubis  Pre-procedure details:    Skin preparation:  Betadine Anesthesia (see MAR for exact dosages):    Anesthesia method:  Local infiltration   Local anesthetic:  Lidocaine 2% w/o epi Procedure details:    Incision types:  Single straight   Scalpel blade:  11   Wound management:  Probed and deloculated   Drainage:  Purulent and bloody   Drainage amount:  Copious   Wound treatment:  Drain placed   Packing materials:  1/2 in gauze   Amount 1/2":  4-5 cm  Post-procedure details:    Patient tolerance of procedure:  Tolerated well, no immediate complications   (including critical care time)  Medications Ordered in UC Medications - No data to display  Initial Impression / Assessment and Plan / UC Course  I have reviewed the triage vital signs and the nursing notes.  Pertinent labs & imaging results that were available during my care of the patient were reviewed by me and considered in my medical decision making (see chart for details).     Noted tachycardia and low grade temps associated with this large area of abscess and cellulitis. Start bactrim today. Recommended recheck in clinic tomorrow to ensure vitals and this diabetic patient improving. Dressing change, may or may not be ready for packing removal tomorrow. Return precautions provided. Patient verbalized understanding and agreeable to plan.    Final Clinical Impressions(s) / UC Diagnoses   Final diagnoses:  Abscess     Discharge Instructions     Keep this dressing in place until follow up tomorrow. Warm compresses.  Please start first dose of antibiotic now.  Ibuprofen for pain and Hydrocodone for breakthrough pain. May cause drowsiness. Please do not take if driving or drinking alcohol.   Please return tomorrow for repeat evaluation and dressing change.    ED Prescriptions    Medication Sig Dispense Auth. Provider   sulfamethoxazole-trimethoprim (BACTRIM DS) 800-160 MG tablet  Take 1 tablet by mouth 2 (two) times daily for 10 days. 20 tablet Linus Mako B, NP   HYDROcodone-acetaminophen (NORCO/VICODIN) 5-325 MG tablet Take 1 tablet by mouth every 6 (six) hours as needed for severe pain. 5 tablet Linus Mako B, NP   ibuprofen (ADVIL,MOTRIN) 800 MG tablet Take 1 tablet (800 mg total) by mouth 3 (three) times daily. 21 tablet Georgetta Haber, NP     Controlled Substance Prescriptions [place] Controlled Substance Registry consulted? Not Applicable   Georgetta Haber, NP 04/11/18 1105

## 2018-04-11 NOTE — ED Triage Notes (Signed)
Pt states he shaved last week, and a bump came up on his groin area, and now its swelled up very large. Large area of swelling noted to groin area.

## 2018-04-12 ENCOUNTER — Ambulatory Visit (HOSPITAL_COMMUNITY)
Admission: EM | Admit: 2018-04-12 | Discharge: 2018-04-12 | Disposition: A | Discharge status: Home / Self Care | Payer: Self-pay

## 2018-04-12 DIAGNOSIS — Z5189 Encounter for other specified aftercare: Secondary | ICD-10-CM

## 2018-04-12 DIAGNOSIS — L02214 Cutaneous abscess of groin: Secondary | ICD-10-CM

## 2018-04-12 NOTE — ED Provider Notes (Signed)
[facility]    CSN: [id number] Arrival date & time: 04/12/18  1128     History   Chief Complaint Chief Complaint  Patient presents with  . Abscess  . Appointment    11:30    HPI [name] is a 37 y.o. male.   Patient is a 37 year old male that presents today for recheck of abscess to right groin area that was I&D yesterday.  He is here to have the packing removed and dressing changed.  He reports that last night he spiked a fever of 102, had night sweats and loss of appetite.  Today he has been afebrile and feels better but is still having lack of appetite and feeling nauseous.  He was able to eat a few crackers earlier today.  He is mildly tachycardic in triage but afebrile.  He has been taking the antibiotics that were prescribed since last night and the pain medication.   ROS per HPI      Past Medical History:  Diagnosis Date  . Diabetes mellitus without complication (HCC)     There are no active problems to display for this patient.   No past surgical history on file.     Home Medications    Prior to Admission medications   Medication Sig Start Date End Date Taking? Authorizing Provider  HYDROcodone-acetaminophen (NORCO/VICODIN) 5-325 MG tablet Take 1 tablet by mouth every 6 (six) hours as needed for severe pain. 04/11/18   Georgetta HaberBurky, Natalie B, NP  ibuprofen (ADVIL,MOTRIN) 800 MG tablet Take 1 tablet (800 mg total) by mouth 3 (three) times daily. 04/11/18   Georgetta HaberBurky, Natalie B, NP  insulin aspart (NOVOLOG) cartridge Inject into the skin 3 (three) times daily with meals.    [provider]  sulfamethoxazole-trimethoprim (BACTRIM DS) 800-160 MG tablet Take 1 tablet by mouth 2 (two) times daily for 10 days. 04/11/18 04/21/18  Georgetta HaberBurky, Natalie B, NP    Family History Family History  Family history unknown: Yes    Social History Social History   Tobacco Use  . Smoking status: Never Smoker  Substance Use Topics  . Alcohol use: Yes  . Drug use: Yes      Types: Marijuana     Allergies   Patient has no known allergies.   Review of Systems Review of Systems   Physical Exam Triage Vital Signs ED Triage Vitals  Enc Vitals Group     BP 04/12/18 1144 135/89     Pulse Rate 04/12/18 1144 (!) 115     Resp 04/12/18 1144 16     Temp 04/12/18 1144 98.9 F (37.2 C)     Temp Source 04/12/18 1144 Oral     SpO2 04/12/18 1144 100 %     Weight --      Height --      Head Circumference --      Peak Flow --      Pain Score 04/12/18 1214 0     Pain Loc --      Pain Edu? --      Excl. in GC? --    No data found.  Updated Vital Signs BP 135/89 (BP Location: Right Arm)   Pulse (!) 115   Temp 98.9 F (37.2 C) (Oral)   Resp 16   SpO2 100%   Visual Acuity Right Eye Distance:   Left Eye Distance:   Bilateral Distance:    Right Eye Near:   Left Eye Near:    Bilateral Near:  Physical Exam  Constitutional: He is oriented to person, place, and time. He appears well-developed and well-nourished. No distress.  HENT:  Head: Normocephalic and atraumatic.  Neck: Normal range of motion.  Pulmonary/Chest: Effort normal.  Neurological: He is alert and oriented to person, place, and time.  Skin: Skin is warm and dry. Capillary refill takes less than 2 seconds. He is not diaphoretic.  Packing removed from the abscess. Wound healing. Pt tolerated well. Redressed area.   Psychiatric: He has a normal mood and affect.  Nursing note and vitals reviewed.    UC Treatments / Results  Labs (all labs ordered are listed, but only abnormal results are displayed) Labs Reviewed - No data to display  EKG None  Radiology No results found.  Procedures Procedures (including critical care time)  Medications Ordered in UC Medications - No data to display  Initial Impression / Assessment and Plan / UC Course  I have reviewed the triage vital signs and the nursing notes.  Pertinent labs & imaging results that were available during my care  of the patient were reviewed by me and considered in my medical decision making (see chart for details).    Pt non toxic or ill appearing.  Pt mildly tachycardic but improved since yesterday here. He is feeling better this am than he did last night and afebrile.  Still nauseous but trying to eat small meals and drink Gatorade. Instructed that if he gets worse over the next 24 hours with high fever, body aches and vomiting he needs to go to the hospital.  She is agreeable to plan.  Offered pt Zofran but refused.  Final Clinical Impressions(s) / UC Diagnoses   Final diagnoses:  Wound check, abscess     Discharge Instructions     It was nice meeting you!!  We removed the packing and changed the dressing. The wound looks good. Please follow up if not feeling better or having worse symptoms in the next 24 hours.      ED Prescriptions    None     Controlled Substance Prescriptions [place] Controlled Substance Registry consulted? Not Applicable   Janace Aris, NP 04/12/18 1223

## 2018-04-12 NOTE — ED Notes (Signed)
Bed: UC01 Expected date:  Expected time:  Means of arrival:  Comments: Appointments 

## 2018-04-12 NOTE — ED Triage Notes (Signed)
Abscess follow up, seen by tracie bast, NP only

## 2018-04-12 NOTE — Discharge Instructions (Addendum)
It was nice meeting you!!  We removed the packing and changed the dressing. The wound looks good. Please follow up if not feeling better or having worse symptoms in the next 24 hours.

## 2018-04-19 ENCOUNTER — Ambulatory Visit (HOSPITAL_COMMUNITY)
Admission: EM | Admit: 2018-04-19 | Discharge: 2018-04-19 | Disposition: A | Discharge status: Home / Self Care | Payer: Self-pay | Attending: Family Medicine | Admitting: Family Medicine

## 2018-04-19 ENCOUNTER — Encounter (HOSPITAL_COMMUNITY): Payer: Self-pay | Admitting: Emergency Medicine

## 2018-04-19 DIAGNOSIS — E119 Type 2 diabetes mellitus without complications: Secondary | ICD-10-CM | POA: Insufficient documentation

## 2018-04-19 DIAGNOSIS — L0291 Cutaneous abscess, unspecified: Secondary | ICD-10-CM

## 2018-04-19 DIAGNOSIS — L02214 Cutaneous abscess of groin: Secondary | ICD-10-CM | POA: Insufficient documentation

## 2018-04-19 DIAGNOSIS — Z794 Long term (current) use of insulin: Secondary | ICD-10-CM | POA: Insufficient documentation

## 2018-04-19 MED ORDER — HYDROCODONE-ACETAMINOPHEN 5-325 MG PO TABS: 1.0000 | ORAL_TABLET | Freq: Four times a day (QID) | ORAL | 0 refills | Status: DC | PRN

## 2018-04-19 NOTE — ED Triage Notes (Signed)
PT had an abscess lanced a week ago. Drainage has continued. PT has been taking his antibiotic

## 2018-04-19 NOTE — ED Provider Notes (Signed)
[facility]    CSN: [id number] Arrival date & time: 04/19/18  1137     History   Chief Complaint Chief Complaint  Patient presents with  . Abscess    HPI [name] is a 37 y.o. male.   pt is a 37 year old male that presents for abscess to right groin area.  This is third visit for the same abscess. I saw him last visit and removed the packing.  He reports no improvement in the abscess and still significant amount of drainage with pain.  He has been taking his antibiotics as prescribed and finishes them on Friday.  He is feeling better overall than he was last visit.  He has had no fever, chills, body aches, night sweats.  He has been eating and drinking okay.  It is more painful at night when sleeping and laying on the right side.   ROS per HPI      Past Medical History:  Diagnosis Date  . Diabetes mellitus without complication (HCC)     There are no active problems to display for this patient.   History reviewed. No pertinent surgical history.     Home Medications    Prior to Admission medications   Medication Sig Start Date End Date Taking? Authorizing Provider  ibuprofen (ADVIL,MOTRIN) 800 MG tablet Take 1 tablet (800 mg total) by mouth 3 (three) times daily. 04/11/18  Yes Burky, Dorene GrebeNatalie B, NP  insulin aspart (NOVOLOG) cartridge Inject into the skin 3 (three) times daily with meals.   Yes [provider]  sulfamethoxazole-trimethoprim (BACTRIM DS) 800-160 MG tablet Take 1 tablet by mouth 2 (two) times daily for 10 days. 04/11/18 04/21/18 Yes Burky, Barron AlvineNatalie B, NP  HYDROcodone-acetaminophen (NORCO/VICODIN) 5-325 MG tablet Take 1 tablet by mouth every 6 (six) hours as needed for severe pain. 04/19/18   Janace ArisBast, Taylinn Brabant A, NP    Family History Family History  Family history unknown: Yes    Social History Social History   Tobacco Use  . Smoking status: Never Smoker  Substance Use Topics  . Alcohol use: Yes  . Drug use: Yes    Types: Marijuana       Allergies   Patient has no known allergies.   Review of Systems Review of Systems   Physical Exam Triage Vital Signs ED Triage Vitals  Enc Vitals Group     BP 04/19/18 1148 (!) 188/105     Pulse Rate 04/19/18 1148 97     Resp 04/19/18 1148 16     Temp 04/19/18 1148 98.3 F (36.8 C)     Temp Source 04/19/18 1148 Oral     SpO2 04/19/18 1148 96 %     Weight --      Height --      Head Circumference --      Peak Flow --      Pain Score 04/19/18 1156 7     Pain Loc --      Pain Edu? --      Excl. in GC? --    No data found.  Updated Vital Signs BP (!) 188/105 (BP Location: Left Arm)   Pulse 97   Temp 98.3 F (36.8 C) (Oral)   Resp 16   SpO2 96%   Visual Acuity Right Eye Distance:   Left Eye Distance:   Bilateral Distance:    Right Eye Near:   Left Eye Near:    Bilateral Near:     Physical Exam  Constitutional: He  is oriented to person, place, and time. He appears well-developed and well-nourished. No distress.  HENT:  Head: Normocephalic and atraumatic.  Neck: Normal range of motion.  Pulmonary/Chest: Effort normal.  Lymphadenopathy:    He has no cervical adenopathy.  Neurological: He is alert and oriented to person, place, and time.  Skin: Skin is warm and dry. He is not diaphoretic.  Abscess to right groin. Still approx same diameter as it was when initially lanced on the 5th. 7 cm. No fluctuant areas, mostly indurated. Purulent Drainage expressed from incision. Tender to touch. No erythema or increased warmth to the area.   Psychiatric: He has a normal mood and affect.  Nursing note and vitals reviewed.    UC Treatments / Results  Labs (all labs ordered are listed, but only abnormal results are displayed) Labs Reviewed  AEROBIC CULTURE (SUPERFICIAL SPECIMEN)    EKG None  Radiology No results found.  Procedures Procedures (including critical care time)  Medications Ordered in UC Medications - No data to display  Initial  Impression / Assessment and Plan / UC Course  I have reviewed the triage vital signs and the nursing notes.  Pertinent labs & imaging results that were available during my care of the patient were reviewed by me and considered in my medical decision making (see chart for details).     Pt non toxic or ill appearing.  Wound culture obtained to ensure correct treatment of antibiotics is being given. Gave pt a few more pain pills and supplies. Told to return if not better in the next week. He may need surgery consult.  Final Clinical Impressions(s) / UC Diagnoses   Final diagnoses:  Abscess     Discharge Instructions     It was nice meeting you!!  We will do a wound culture to see what kind of bacteria this is.  Keep monitoring and follow up as needed.  I will give you a few more pain pills.      ED Prescriptions    Medication Sig Dispense Auth. Provider   HYDROcodone-acetaminophen (NORCO/VICODIN) 5-325 MG tablet Take 1 tablet by mouth every 6 (six) hours as needed for severe pain. 6 tablet Dahlia ByesBast, Wai Litt A, NP     Controlled Substance Prescriptions [place] Controlled Substance Registry consulted? Not Applicable   Janace ArisBast, Race Latour A, NP 04/19/18 1546

## 2018-04-19 NOTE — Discharge Instructions (Addendum)
It was nice meeting you!!  We will do a wound culture to see what kind of bacteria this is.  Keep monitoring and follow up as needed.  I will give you a few more pain pills.

## 2018-04-22 LAB — AEROBIC CULTURE  (SUPERFICIAL SPECIMEN)

## 2018-04-22 LAB — AEROBIC CULTURE W GRAM STAIN (SUPERFICIAL SPECIMEN)

## 2018-04-25 ENCOUNTER — Telehealth (HOSPITAL_COMMUNITY): Payer: Self-pay

## 2018-04-25 NOTE — Telephone Encounter (Signed)
Culture positive for staph patient was treated with bactrim. Attempted to reach patient. No answer at this time.

## 2019-01-22 ENCOUNTER — Other Ambulatory Visit: Payer: Self-pay

## 2019-01-22 ENCOUNTER — Encounter (HOSPITAL_COMMUNITY): Payer: Self-pay

## 2019-01-22 ENCOUNTER — Ambulatory Visit (HOSPITAL_COMMUNITY)
Admission: EM | Admit: 2019-01-22 | Discharge: 2019-01-22 | Disposition: A | Discharge status: Home / Self Care | Payer: No Typology Code available for payment source

## 2019-01-22 ENCOUNTER — Emergency Department (HOSPITAL_COMMUNITY)
Admission: EM | Admit: 2019-01-22 | Discharge: 2019-01-22 | Disposition: A | Discharge status: Home / Self Care | Payer: Self-pay | Attending: Emergency Medicine | Admitting: Emergency Medicine

## 2019-01-22 ENCOUNTER — Emergency Department (HOSPITAL_COMMUNITY): Payer: Self-pay

## 2019-01-22 DIAGNOSIS — W2209XA Striking against other stationary object, initial encounter: Secondary | ICD-10-CM | POA: Insufficient documentation

## 2019-01-22 DIAGNOSIS — F1290 Cannabis use, unspecified, uncomplicated: Secondary | ICD-10-CM | POA: Insufficient documentation

## 2019-01-22 DIAGNOSIS — Y990 Civilian activity done for income or pay: Secondary | ICD-10-CM | POA: Insufficient documentation

## 2019-01-22 DIAGNOSIS — S81801A Unspecified open wound, right lower leg, initial encounter: Secondary | ICD-10-CM

## 2019-01-22 DIAGNOSIS — L089 Local infection of the skin and subcutaneous tissue, unspecified: Secondary | ICD-10-CM

## 2019-01-22 DIAGNOSIS — Z23 Encounter for immunization: Secondary | ICD-10-CM | POA: Insufficient documentation

## 2019-01-22 DIAGNOSIS — Y939 Activity, unspecified: Secondary | ICD-10-CM | POA: Insufficient documentation

## 2019-01-22 DIAGNOSIS — E119 Type 2 diabetes mellitus without complications: Secondary | ICD-10-CM | POA: Insufficient documentation

## 2019-01-22 DIAGNOSIS — I10 Essential (primary) hypertension: Secondary | ICD-10-CM

## 2019-01-22 DIAGNOSIS — T148XXA Other injury of unspecified body region, initial encounter: Secondary | ICD-10-CM

## 2019-01-22 DIAGNOSIS — Y9289 Other specified places as the place of occurrence of the external cause: Secondary | ICD-10-CM | POA: Insufficient documentation

## 2019-01-22 DIAGNOSIS — E1165 Type 2 diabetes mellitus with hyperglycemia: Secondary | ICD-10-CM

## 2019-01-22 LAB — CBC WITH DIFFERENTIAL/PLATELET
Abs Immature Granulocytes: 0.01 10*3/uL (ref 0.00–0.07)
Basophils Absolute: 0 10*3/uL (ref 0.0–0.1)
Basophils Relative: 1 %
Eosinophils Absolute: 0.1 10*3/uL (ref 0.0–0.5)
Eosinophils Relative: 2 %
HCT: 45.4 % (ref 39.0–52.0)
Hemoglobin: 15.3 g/dL (ref 13.0–17.0)
Immature Granulocytes: 0 %
Lymphocytes Relative: 25 %
Lymphs Abs: 1.3 10*3/uL (ref 0.7–4.0)
MCH: 28.4 pg (ref 26.0–34.0)
MCHC: 33.7 g/dL (ref 30.0–36.0)
MCV: 84.2 fL (ref 80.0–100.0)
Monocytes Absolute: 0.4 10*3/uL (ref 0.1–1.0)
Monocytes Relative: 8 %
Neutro Abs: 3.4 10*3/uL (ref 1.7–7.7)
Neutrophils Relative %: 64 %
Platelets: 265 10*3/uL (ref 150–400)
RBC: 5.39 MIL/uL (ref 4.22–5.81)
RDW: 12.2 % (ref 11.5–15.5)
WBC: 5.3 10*3/uL (ref 4.0–10.5)
nRBC: 0 % (ref 0.0–0.2)

## 2019-01-22 LAB — COMPREHENSIVE METABOLIC PANEL
ALT: 19 U/L (ref 0–44)
AST: 21 U/L (ref 15–41)
Albumin: 4.4 g/dL (ref 3.5–5.0)
Alkaline Phosphatase: 68 U/L (ref 38–126)
Anion gap: 9 (ref 5–15)
BUN: 17 mg/dL (ref 6–20)
CO2: 24 mmol/L (ref 22–32)
Calcium: 9.6 mg/dL (ref 8.9–10.3)
Chloride: 107 mmol/L (ref 98–111)
Creatinine, Ser: 1.18 mg/dL (ref 0.61–1.24)
GFR calc Af Amer: >60 mL/min (ref >60)
GFR calc non Af Amer: >60 mL/min (ref >60)
Glucose, Bld: 132 mg/dL — ABNORMAL HIGH (ref 70–99)
Potassium: 3.5 mmol/L (ref 3.5–5.1)
Sodium: 140 mmol/L (ref 135–145)
Total Bilirubin: 0.6 mg/dL (ref 0.3–1.2)
Total Protein: 7.6 g/dL (ref 6.5–8.1)

## 2019-01-22 LAB — URINALYSIS, ROUTINE W REFLEX MICROSCOPIC
Bilirubin Urine: NEGATIVE
Glucose, UA: NEGATIVE mg/dL
Hgb urine dipstick: NEGATIVE
Ketones, ur: NEGATIVE mg/dL
Leukocytes,Ua: NEGATIVE
Nitrite: NEGATIVE
Protein, ur: NEGATIVE mg/dL
Specific Gravity, Urine: 1.02 (ref 1.005–1.030)
pH: 6 (ref 5.0–8.0)

## 2019-01-22 LAB — LACTIC ACID, PLASMA: Lactic Acid, Venous: 1.1 mmol/L (ref 0.5–1.9)

## 2019-01-22 MED ORDER — TETANUS-DIPHTH-ACELL PERTUSSIS 5-2.5-18.5 LF-MCG/0.5 IM SUSP
0.5000 mL | Freq: Once | INTRAMUSCULAR | Status: AC
  Administered 2019-01-22: 0.5 mL via INTRAMUSCULAR
  Filled 2019-01-22: qty 0.5

## 2019-01-22 MED ORDER — SODIUM CHLORIDE 0.9% FLUSH: 3.0000 mL | Freq: Once | INTRAVENOUS | Status: DC

## 2019-01-22 NOTE — ED Triage Notes (Signed)
Pt sent here from UC for necrotic wound on right shin, pt injured it a few weeks ago, hx of diabetes. Wound draining.

## 2019-01-22 NOTE — ED Provider Notes (Signed)
  MRN: [id number] DOB: 05/06/1981  Subjective:   [name] is a 38 y.o. male presenting for 3-week history of progressively worsening wound over his right shin.  Patient reports that he suffered a laceration while at work and has been trying to treat it on his own.  Patient has severely uncontrolled diabetes and states that he sometimes uses his wife's insulin when he feels bad due to his blood sugar but does not check his blood sugar.  He also has uncontrolled hypertension but believes this is due to his laceration.  He has had seepage and drainage coming from his wound and there is an odor per patient.  Denies fever, nausea, vomiting, red streaks, swelling of his calf or upper part of his right lower leg.  No Known Allergies  Past Medical History:  Diagnosis Date  . Diabetes mellitus without complication (HCC)     History reviewed. No pertinent surgical history.  ROS  Objective:   Vitals: BP (!) 185/105 (BP Location: Left Arm)   Pulse 87   Temp 98.7 F (37.1 C) (Oral)   Resp 16   SpO2 100%   Physical Exam Constitutional:      Appearance: Normal appearance. He is well-developed and normal weight.  HENT:     Head: Normocephalic and atraumatic.     Right Ear: External ear normal.     Left Ear: External ear normal.     Nose: Nose normal.     Mouth/Throat:     Pharynx: Oropharynx is clear.  Eyes:     Extraocular Movements: Extraocular movements intact.     Pupils: Pupils are equal, round, and reactive to light.  Cardiovascular:     Rate and Rhythm: Normal rate.  Pulmonary:     Effort: Pulmonary effort is normal.  Skin:    Comments: Necrotic and infected wound over right lower extremity as depicted.  Neurological:     Mental Status: He is alert and oriented to person, place, and time.  Psychiatric:        Mood and Affect: Mood normal.        Behavior: Behavior normal.      Assessment and Plan :   Infected wound  Type 2 diabetes mellitus with hyperglycemia,  without long-term current use of insulin (HCC)  Essential hypertension  Morbid obesity (HCC)  Patient was redirected to the emergency room for emergent consult on a necrotic and infected wound.  Counseled patient that this is for consideration of surgical debridement, IV antibiotics.  Patient is in agreement with treatment plan as he is worried from his diabetes and known complications including amputations.  Patient contracts for safety and will report there by personal vehicle.    Wallis Bamberg, PA-C 01/22/19 1314

## 2019-01-22 NOTE — ED Notes (Signed)
Patient verbalizes understanding of discharge instructions. Opportunity for questioning and answers were provided. Armband removed by staff, pt discharged from ED.  

## 2019-01-22 NOTE — ED Triage Notes (Signed)
Pt states he fell 3 weeks ago and injured his right left at work. Pt states he jumped over a convera belt. Pt slipped and fell he caught his right leg on the edge of a metal strip and ripped his leg open.

## 2019-01-22 NOTE — ED Provider Notes (Signed)
[name] [facility] EMERGENCY DEPARTMENT Provider Note   CSN: [id number] Arrival date & time: 01/22/19  1320    History   Chief Complaint Chief Complaint  Patient presents with  . Wound Infection    HPI [name] is a 38 y.o. male.     38 year old male with past medical history of diabetes presents with complaint of right lower leg wound.  Patient states that he was at work at the end of April when he jumped over a conveyor belt and hit his lower leg on the conveyor belt resulting in a laceration to the leg.  Patient states the laceration was fairly deep however he preferred to treat this at home and not go to the hospital at that time.  Patient states he cleaned the area with peroxide and has been washing the area with soap in the shower.  Patient's wife is a CNA and has been treating his wound at home, was concerned that the area may be infected and sent patient to the urgent care for evaluation today, patient was sent from urgent care to the ER.  Patient states his leg has been swollen however the swelling has improved as of today, he does have some pain in the area.  Patient has diabetes, does not take any medications and does not alter his blood sugar however states that he feels like he is feeling unwell due to diabetes he will take some of his wife's insulin. No other complaints or concerns.      Past Medical History:  Diagnosis Date  . Diabetes mellitus without complication (HCC)     There are no active problems to display for this patient.   History reviewed. No pertinent surgical history.      Home Medications    Prior to Admission medications   Not on File    Family History Family History  Family history unknown: Yes    Social History Social History   Tobacco Use  . Smoking status: Never Smoker  Substance Use Topics  . Alcohol use: Yes  . Drug use: Yes    Types: Marijuana     Allergies   Patient has no known allergies.   Review  of Systems Review of Systems  Constitutional: Negative for fever.  Gastrointestinal: Negative for nausea and vomiting.  Musculoskeletal: Positive for myalgias.  Skin: Positive for wound.  Allergic/Immunologic: Positive for immunocompromised state.  Neurological: Negative for weakness and numbness.  Hematological: Negative for adenopathy.     Physical Exam Updated Vital Signs BP (!) 180/90 (BP Location: Right Arm)   Pulse 90   Temp 98.2 F (36.8 C) (Oral)   Resp 16   SpO2 100%   Physical Exam Musculoskeletal:       Legs:      Media Information      Document Information     Photos    01/22/2019 14:02  Attached To:  Hospital Encounter on 01/22/19  Source Information   [name]  [facility]     ED Treatments / Results  Labs (all labs ordered are listed, but only abnormal results are displayed) Labs Reviewed  COMPREHENSIVE METABOLIC PANEL - Abnormal; Notable for the following components:      Result Value   Glucose, Bld 132 (*)    All other components within normal limits  CULTURE, BLOOD (ROUTINE X 2)  CULTURE, BLOOD (ROUTINE X 2)  LACTIC ACID, PLASMA  CBC WITH DIFFERENTIAL/PLATELET  URINALYSIS, ROUTINE W REFLEX MICROSCOPIC  LACTIC  ACID, PLASMA    EKG None  Radiology Dg Tibia/fibula Right  Result Date: 01/22/2019 CLINICAL DATA:  Patient fell 3 weeks ago suffering a laceration with persistent pain. EXAM: RIGHT TIBIA AND FIBULA - 2 VIEW COMPARISON:  None. FINDINGS: There is an apparent laceration about the anterior aspect of the lower leg/shin. No associated fracture or radiopaque foreign body. No discrete areas of osteolysis to suggest osteomyelitis. Limited visualization of the adjacent knee and ankle is normal given obliquity and large field of view. IMPRESSION: Laceration about the anterior aspect of the shin without associated fracture, radiopaque foreign body or radiographic evidence of osteomyelitis. Electronically Signed    By: Simonne ComeJohn  Watts M.D.   On: 01/22/2019 14:57    Procedures Procedures (including critical care time)  Medications Ordered in ED Medications  sodium chloride flush (NS) 0.9 % injection 3 mL (has no administration in time range)  Tdap (BOOSTRIX) injection 0.5 mL (0.5 mLs Intramuscular Given 01/22/19 1521)     Initial Impression / Assessment and Plan / ED Course  I have reviewed the triage vital signs and the nursing notes.  Pertinent labs & imaging results that were available during my care of the patient were reviewed by me and considered in my medical decision making (see chart for details).  Clinical Course as of Jan 22 1543  Sun Jan 22, 2019  [id number] 38 year old male with history of diabetes and right lower leg wound since injury at work at the end of April.  She has been trying to care for the wound at home, wife was concerned the area may be infected and patient went to urgent care today, sent to ER for further evaluation.  On exam patient has a 3 cm x 4 cm wound to the anterior right lower leg without purulent drainage or surrounding erythema.  X-rays negative for osteomyelitis or fracture or retained foreign body.  Tetanus was updated today.  Lab work with normal urinalysis, lactic acid x1 normal, CBC unremarkable, CMP with mildly elevated glucose at 132. Case discussed with Dr. Charm BargesButler, ER attending, area does not appear necrotic, no evidence of infection, x-ray does not show osteomyelitis. Patient referred to wound care for follow up, advised to discuss with his supervisor for workers comp, may need WC eval for referral. Return to ER for foul smelling or cloudy drainage, redness, worsening pain or any other concerns.    [LM]    Clinical Course User Index [LM] Jeannie FendMurphy, Makiyah Zentz A, PA-C      Final Clinical Impressions(s) / ED Diagnoses   Final diagnoses:  Open wound of right lower leg, initial encounter    ED Discharge Orders    None       Jeannie FendMurphy, Ailyne Pawley A, PA-C 01/22/19 1544     Terrilee FilesButler, Michael C, MD 01/24/19 [phone]

## 2019-01-22 NOTE — Discharge Instructions (Addendum)
Clean with mild soap, apply bacitracin to area. Follow up with wound clinic or workers comp provider. Return to ER for foul smelling or cloudy drainage, redness, any concerning symptoms.

## 2019-01-22 NOTE — ED Notes (Signed)
Patient complains of right lower leg laceration after a fall while at work X approximately 3 weeks. Patient has not been seen for this issue.

## 2019-01-22 NOTE — Discharge Instructions (Signed)
Please report to the emergency room immediately for urgent intervention and evaluation of your infected wound in the setting of severely uncontrolled diabetes and hypertension.  There you will be evaluated by different team but my hope is that you get a consult for your severely infected wound.

## 2019-01-22 NOTE — ED Notes (Signed)
Patient transported to X-ray 

## 2019-01-27 LAB — CULTURE, BLOOD (ROUTINE X 2)
Culture: NO GROWTH
Culture: NO GROWTH

## 2019-10-17 ENCOUNTER — Encounter: Payer: Self-pay | Admitting: Nurse Practitioner

## 2019-10-17 ENCOUNTER — Ambulatory Visit: Payer: BC Managed Care – PPO | Admitting: Nurse Practitioner

## 2019-10-17 ENCOUNTER — Other Ambulatory Visit: Payer: Self-pay

## 2019-10-17 VITALS — BP 138/88 | HR 92 | Temp 98.9°F | Ht 73.0 in | Wt 312.0 lb

## 2019-10-17 DIAGNOSIS — K130 Diseases of lips: Secondary | ICD-10-CM

## 2019-10-17 DIAGNOSIS — E559 Vitamin D deficiency, unspecified: Secondary | ICD-10-CM

## 2019-10-17 DIAGNOSIS — I10 Essential (primary) hypertension: Secondary | ICD-10-CM | POA: Diagnosis not present

## 2019-10-17 DIAGNOSIS — E119 Type 2 diabetes mellitus without complications: Secondary | ICD-10-CM | POA: Insufficient documentation

## 2019-10-17 DIAGNOSIS — R5383 Other fatigue: Secondary | ICD-10-CM | POA: Diagnosis not present

## 2019-10-17 LAB — POCT URINALYSIS DIPSTICK
Bilirubin, UA: NEGATIVE
Blood, UA: NEGATIVE
Glucose, UA: NEGATIVE
Ketones, UA: NEGATIVE
Leukocytes, UA: NEGATIVE
Nitrite, UA: NEGATIVE
Protein, UA: NEGATIVE
Spec Grav, UA: >=1.03 — AB (ref 1.010–1.025)
Urobilinogen, UA: 0.2 E.U./dL
pH, UA: 6 (ref 5.0–8.0)

## 2019-10-17 LAB — POCT UA - MICROALBUMIN
Albumin/Creatinine Ratio, Urine, POC: <30
Creatinine, POC: 300 mg/dL
Microalbumin Ur, POC: 30 mg/L

## 2019-10-17 NOTE — Patient Instructions (Signed)
   You can take tumeric for your aches and pains.   Eat a low inflammatory diet.

## 2019-10-17 NOTE — Progress Notes (Signed)
This visit occurred during the SARS-CoV-2 public health emergency.  Safety protocols were in place, including screening questions prior to the visit, additional usage of staff PPE, and extensive cleaning of exam room while observing appropriate contact time as indicated for disinfecting solutions.  Subjective:     Patient ID: [name] , male    DOB: [birth year] , 39 y.o.   MRN: [id number]   Chief Complaint  Patient presents with  . Establish Care    elevated b/p body aches    HPI  Here to establish care - has never had insurance so has not had a provider.  He is here with his wife who used to be a patient with Dr. Baird [facility]. Works for [employer] loading trucks.  Married.    PMH - Hypertension, diabetes (he was on sliding scale).  His HgbA1c was up to 14, he said the last time he had been at the hospital his HgbA1c was better.   [facility] - mother - sleep apnea, father - asthma  Sister with thyroid problems   He reports he lost weight by working during the summer.   Complains of generalized pain all over from working at [place]  Hypertension This is a chronic problem. The current episode started more than 1 year ago. The problem is controlled. Pertinent negatives include no blurred vision, chest pain, headaches or palpitations. There are no associated agents to hypertension. There are no known risk factors for coronary artery disease. Past treatments include nothing. There are no compliance problems.  There is no history of chronic renal disease.  Diabetes Pertinent negatives for hypoglycemia include no headaches. Pertinent negatives for diabetes include no blurred vision, no chest pain, no polydipsia, no polyphagia and no polyuria.     Past Medical History:  Diagnosis Date  . Diabetes mellitus without complication ([place])      Family History  Family history unknown: Yes     Current Outpatient Medications:  Marland Kitchen  Multiple Vitamin (MULTIVITAMIN) tablet, Take 1 tablet by mouth daily., Disp: , Rfl:     No Known Allergies   Review of Systems  Constitutional: Negative.   Eyes: Negative for blurred vision.  Respiratory: Negative.   Cardiovascular: Negative for chest pain, palpitations and leg swelling.  Endocrine: Negative for polydipsia, polyphagia and polyuria.  Neurological: Negative for headaches.  Psychiatric/Behavioral: Negative.      Today's Vitals   10/17/19 1445  BP: 138/88  Pulse: 92  Temp: 98.9 F (37.2 C)  TempSrc: Oral  Weight: (!) 312 lb (141.5 kg)  Height: '6\' 1"'  (1.854 m)   Body mass index is 41.16 kg/m.   Objective:  Physical Exam Vitals reviewed.  Constitutional:      General: He is not in acute distress.    Appearance: Normal appearance.  Cardiovascular:     Rate and Rhythm: Normal rate and regular rhythm.     Pulses: Normal pulses.     Heart sounds: No murmur.  Pulmonary:     Effort: Pulmonary effort is normal. No respiratory distress.     Breath sounds: Normal breath sounds.  Skin:    General: Skin is warm and dry.     Capillary Refill: Capillary refill takes less than 2 seconds.  Neurological:     General: No focal deficit present.     Mental Status: He is alert and oriented to person, place, and time.  Psychiatric:        Mood and Affect: Mood normal.        Behavior:  Behavior normal.        Thought Content: Thought content normal.        Judgment: Judgment normal.         Assessment And Plan:     1. Essential hypertension  Blood pressure is controlled today not on any medication - Hemoglobin A1c  2. Type 2 diabetes mellitus without complication, without long-term current use of insulin (HCC)  Previous HgbA1c was elevated above 10 but has not had done recently  Will check HgbA1c  - Hemoglobin A1c - CMP14+EGFR - Lipid panel  3. Fatigue, unspecified type  Will check metabolic causes  He does admit to working strenuous job - Vitamin B12  4. Chapped lips  Will check vitamin levels and encouraged to make sure drinking  adequate amounts of water  - VITAMIN D 25 Hydroxy (Vit-D Deficiency, Fractures) - TSH - Vitamin B12   Minette Brine, FNP    THE PATIENT IS ENCOURAGED TO PRACTICE SOCIAL DISTANCING DUE TO THE COVID-19 PANDEMIC.

## 2019-10-18 ENCOUNTER — Encounter: Payer: Self-pay | Admitting: Nurse Practitioner

## 2019-10-18 LAB — TSH: TSH: 2.12 mIU/L (ref 0.40–4.50)

## 2019-10-18 LAB — COMPLETE METABOLIC PANEL WITHOUT GFR
AG Ratio: 2.3 (calc) (ref 1.0–2.5)
ALT: 23 U/L (ref 9–46)
AST: 25 U/L (ref 10–40)
Albumin: 4.6 g/dL (ref 3.6–5.1)
Alkaline phosphatase (APISO): 44 U/L (ref 36–130)
BUN: 17 mg/dL (ref 7–25)
CO2: 27 mmol/L (ref 20–32)
Calcium: 9.9 mg/dL (ref 8.6–10.3)
Chloride: 106 mmol/L (ref 98–110)
Creat: 1.12 mg/dL (ref 0.60–1.35)
GFR, Est African American: 97 mL/min/1.73m2
GFR, Est Non African American: 84 mL/min/1.73m2
Globulin: 2 g/dL (ref 1.9–3.7)
Glucose, Bld: 88 mg/dL (ref 65–99)
Potassium: 3.8 mmol/L (ref 3.5–5.3)
Sodium: 142 mmol/L (ref 135–146)
Total Bilirubin: 0.7 mg/dL (ref 0.2–1.2)
Total Protein: 6.6 g/dL (ref 6.1–8.1)

## 2019-10-18 LAB — LIPID PANEL
Cholesterol: 189 mg/dL
HDL: 51 mg/dL
LDL Cholesterol (Calc): 116 mg/dL — ABNORMAL HIGH
Non-HDL Cholesterol (Calc): 138 mg/dL — ABNORMAL HIGH
Total CHOL/HDL Ratio: 3.7 (calc)
Triglycerides: 113 mg/dL

## 2019-10-18 LAB — HEMOGLOBIN A1C
Hgb A1c MFr Bld: 5.6 %{Hb}
Mean Plasma Glucose: 114 (calc)
eAG (mmol/L): 6.3 (calc)

## 2019-10-18 LAB — VITAMIN D 25 HYDROXY (VIT D DEFICIENCY, FRACTURES): Vit D, 25-Hydroxy: 15 ng/mL — ABNORMAL LOW (ref 30–100)

## 2019-10-19 ENCOUNTER — Telehealth: Payer: Self-pay

## 2019-10-19 MED ORDER — VITAMIN D (ERGOCALCIFEROL) 1.25 MG (50000 UNIT) PO CAPS: 50000.0000 [IU] | ORAL_CAPSULE | ORAL | 0 refills | Status: DC

## 2019-10-19 MED FILL — VIT D2 1.25 MG (50,000 UNIT: 1.25 MG | 84 days supply | Qty: 12 | Fill #0

## 2019-10-19 NOTE — Telephone Encounter (Signed)
Patient's wife notified of lab results. YRL,RMA

## 2019-10-22 ENCOUNTER — Encounter: Payer: Self-pay | Admitting: Nurse Practitioner

## 2019-10-31 ENCOUNTER — Other Ambulatory Visit: Payer: Self-pay

## 2019-10-31 ENCOUNTER — Ambulatory Visit: Payer: BC Managed Care – PPO | Admitting: Nurse Practitioner

## 2019-10-31 ENCOUNTER — Encounter: Payer: Self-pay | Admitting: Nurse Practitioner

## 2019-10-31 VITALS — BP 160/100 | HR 64 | Temp 98.5°F | Ht 71.0 in | Wt 315.6 lb

## 2019-10-31 DIAGNOSIS — N529 Male erectile dysfunction, unspecified: Secondary | ICD-10-CM

## 2019-10-31 DIAGNOSIS — I10 Essential (primary) hypertension: Secondary | ICD-10-CM | POA: Diagnosis not present

## 2019-10-31 MED ORDER — AMLODIPINE BESYLATE 5 MG PO TABS: 5.0000 mg | ORAL_TABLET | Freq: Every day | ORAL | 11 refills | Status: DC

## 2019-10-31 MED FILL — AMLODIPINE BESYLATE 5 MG TA: 5 | 30 days supply | Qty: 30 | Fill #0

## 2019-10-31 NOTE — Addendum Note (Signed)
Addended by: Ina Kick on: 10/31/2019 05:23 PM   Modules accepted: Orders

## 2019-10-31 NOTE — Progress Notes (Signed)
This visit occurred during the SARS-CoV-2 public health emergency.  Safety protocols were in place, including screening questions prior to the visit, additional usage of staff PPE, and extensive cleaning of exam room while observing appropriate contact time as indicated for disinfecting solutions.  Subjective:     Patient ID: [name] , male    DOB: [birth year] , 39 y.o.   MRN: [id number]   Chief Complaint  Patient presents with  . bloodwork    patient would like to have his testosterone checked    HPI  He is concerned he has low testosterone.  He has had problems since 2012, worsened since 2017.  He is interested but has trouble with the act.      Past Medical History:  Diagnosis Date  . Diabetes mellitus without complication (HCC)      Family History  Family history unknown: Yes     Current Outpatient Medications:  Marland Kitchen  Multiple Vitamin (MULTIVITAMIN) tablet, Take 1 tablet by mouth daily., Disp: , Rfl:  .  Vitamin D, Ergocalciferol, (DRISDOL) 1.25 MG (50000 UNIT) CAPS capsule, Take 1 capsule (50,000 Units total) by mouth every 7 (seven) days., Disp: 12 capsule, Rfl: 0   No Known Allergies   Review of Systems  Constitutional: Negative.   Respiratory: Negative.   Cardiovascular: Negative for chest pain, palpitations and leg swelling.     Today's Vitals   10/31/19 1557  BP: (!) 160/100  Pulse: 64  Temp: 98.5 F (36.9 C)  TempSrc: Oral  Weight: (!) 315 lb 9.6 oz (143.2 kg)  Height: 5\' 11"  (1.803 m)  PainSc: 0-No pain   Body mass index is 44.02 kg/m.   Objective:  Physical Exam Vitals reviewed.  Constitutional:      General: He is not in acute distress.    Appearance: Normal appearance. He is obese.  Cardiovascular:     Rate and Rhythm: Normal rate and regular rhythm.     Pulses: Normal pulses.     Heart sounds: Normal heart sounds.  Pulmonary:     Effort: Pulmonary effort is normal. No respiratory distress.     Breath sounds: Normal breath sounds.   Skin:    Capillary Refill: Capillary refill takes less than 2 seconds.  Neurological:     General: No focal deficit present.     Mental Status: He is alert and oriented to person, place, and time.  Psychiatric:        Mood and Affect: Mood normal.        Behavior: Behavior normal.        Thought Content: Thought content normal.        Judgment: Judgment normal.         Assessment And Plan:      1. Essential hypertension  Chronic, blood pressure is elevated today will start him on amlodipine, return in 1 week for blood pressure nurse visit  Advised to avoid high salt foods.   EKG done with NSR  - EKG 12-Lead - amLODipine (NORVASC) 5 MG tablet; Take 1 tablet (5 mg total) by mouth daily.  Dispense: 30 tablet; Refill: 11  2. Erectile dysfunction, unspecified erectile dysfunction type  Reports at least a 9 year history of erectile dysfunction has not had this evaluated   Will check testosterone level   Also encouraged to avoid smoking marijuana this can affect his libido.   - Testosterone, Total      , FNP    THE PATIENT IS ENCOURAGED TO PRACTICE  SOCIAL DISTANCING DUE TO THE COVID-19 PANDEMIC.

## 2019-11-01 ENCOUNTER — Encounter: Payer: Self-pay | Admitting: Nurse Practitioner

## 2019-11-01 ENCOUNTER — Telehealth: Payer: Self-pay

## 2019-11-01 NOTE — Telephone Encounter (Signed)
I called pt and left him a v/m to call the office regarding his labs..  his testosterone was normal. YRL,RMA

## 2019-11-03 MED FILL — VIT D2 1.25 MG (50,000 UNIT: 1.25 MG | 84 days supply | Qty: 12 | Fill #0

## 2019-11-07 ENCOUNTER — Ambulatory Visit: Payer: BC Managed Care – PPO

## 2019-11-07 ENCOUNTER — Other Ambulatory Visit: Payer: Self-pay

## 2019-11-07 VITALS — BP 160/90 | HR 99 | Temp 98.9°F | Ht 70.6 in | Wt 312.4 lb

## 2019-11-07 DIAGNOSIS — I10 Essential (primary) hypertension: Secondary | ICD-10-CM

## 2019-11-07 MED ORDER — AMLODIPINE BESYLATE 10 MG PO TABS: 10.0000 mg | ORAL_TABLET | Freq: Every day | ORAL | 2 refills | Status: DC

## 2019-11-07 NOTE — Progress Notes (Signed)
Pt was seen today for a blood pressure check. Pt advised to increase norvasc to 10mg 

## 2019-11-27 ENCOUNTER — Encounter: Payer: Self-pay | Admitting: Nurse Practitioner

## 2019-11-28 ENCOUNTER — Other Ambulatory Visit: Payer: Self-pay

## 2019-11-28 ENCOUNTER — Encounter: Payer: Self-pay | Admitting: Nurse Practitioner

## 2019-11-28 ENCOUNTER — Ambulatory Visit: Payer: BC Managed Care – PPO

## 2019-11-28 VITALS — BP 124/86 | HR 86 | Temp 98.7°F | Ht 71.4 in | Wt 312.8 lb

## 2019-11-28 DIAGNOSIS — I10 Essential (primary) hypertension: Secondary | ICD-10-CM

## 2019-11-28 NOTE — Progress Notes (Signed)
Patient presented in office today for a BP check. Patient has been drinking 5-6 16oz bottles of water during the day at night he drinks vitamin water. Patients BP is 124/86 Pulse 86. Patient will follow up in 38mo.

## 2019-12-06 ENCOUNTER — Other Ambulatory Visit: Payer: Self-pay | Admitting: Nurse Practitioner

## 2019-12-06 DIAGNOSIS — N529 Male erectile dysfunction, unspecified: Secondary | ICD-10-CM

## 2020-01-16 ENCOUNTER — Other Ambulatory Visit: Payer: Self-pay | Admitting: Nurse Practitioner

## 2020-01-16 ENCOUNTER — Encounter: Payer: Self-pay | Admitting: Nurse Practitioner

## 2020-01-16 ENCOUNTER — Ambulatory Visit: Payer: BC Managed Care – PPO | Admitting: Nurse Practitioner

## 2020-01-25 ENCOUNTER — Encounter: Payer: Self-pay | Admitting: Nurse Practitioner

## 2020-01-25 ENCOUNTER — Other Ambulatory Visit: Payer: Self-pay

## 2020-01-25 ENCOUNTER — Ambulatory Visit: Payer: BC Managed Care – PPO | Admitting: Nurse Practitioner

## 2020-01-25 VITALS — BP 138/80 | HR 91 | Temp 98.6°F | Ht 71.6 in | Wt 306.8 lb

## 2020-01-25 DIAGNOSIS — I10 Essential (primary) hypertension: Secondary | ICD-10-CM

## 2020-01-25 NOTE — Progress Notes (Signed)
  This visit occurred during the SARS-CoV-2 public health emergency.  Safety protocols were in place, including screening questions prior to the visit, additional usage of staff PPE, and extensive cleaning of exam room while observing appropriate contact time as indicated for disinfecting solutions.  Subjective:     Patient ID: [name] , male    DOB: 1981-09-05 , 39 y.o.   MRN: [id number]   Chief Complaint  Patient presents with  . Hypertension    HPI  Wt Readings from Last 3 Encounters: 01/25/20 : (!) 306 lb 12.8 oz (139.2 kg) 11/28/19 : (!) 312 lb 12.8 oz (141.9 kg) 11/07/19 : (!) 312 lb 6.4 oz (141.7 kg)   Hypertension This is a chronic problem. The current episode started more than 1 year ago. The problem is controlled. Pertinent negatives include no anxiety or blurred vision. There are no associated agents to hypertension. Risk factors for coronary artery disease include sedentary lifestyle, male gender and obesity. Past treatments include calcium channel blockers. The current treatment provides no improvement. There are no compliance problems.  There is no history of angina. There is no history of chronic renal disease.     Past Medical History:  Diagnosis Date  . Diabetes mellitus without complication (HCC)      Family History  Family history unknown: Yes     Current Outpatient Medications:  .  amLODipine (NORVASC) 10 MG tablet, TAKE 1 TABLET BY MOUTH EVERY DAY, Disp: 30 tablet, Rfl: 2 .  Multiple Vitamin (MULTIVITAMIN) tablet, Take 1 tablet by mouth daily., Disp: , Rfl:  .  Vitamin D, Ergocalciferol, (DRISDOL) 1.25 MG (50000 UNIT) CAPS capsule, Take 1 capsule (50,000 Units total) by mouth every 7 (seven) days., Disp: 12 capsule, Rfl: 0   No Known Allergies   Review of Systems  Eyes: Negative for blurred vision.     Today's Vitals   01/25/20 1546  BP: 138/80  Pulse: 91  Temp: 98.6 F (37 C)  TempSrc: Oral  Weight: (!) 306 lb 12.8 oz (139.2 kg)  Height:  5' 11.6" (1.819 m)  PainSc: 0-No pain   Body mass index is 42.08 kg/m.   Objective:  Physical Exam      Assessment And Plan:     1. Essential hypertension  Chronic, blood pressure is much better controlled  Continue with current medications     [name], FNP    THE PATIENT IS ENCOURAGED TO PRACTICE SOCIAL DISTANCING DUE TO THE COVID-19 PANDEMIC.

## 2020-02-04 ENCOUNTER — Encounter: Payer: Self-pay | Admitting: Nurse Practitioner

## 2020-02-14 ENCOUNTER — Encounter: Payer: Self-pay | Admitting: Nurse Practitioner

## 2020-02-15 ENCOUNTER — Other Ambulatory Visit: Payer: Self-pay | Admitting: Nurse Practitioner

## 2020-02-15 MED ORDER — CYCLOBENZAPRINE HCL 10 MG PO TABS: 10.0000 mg | ORAL_TABLET | Freq: Three times a day (TID) | ORAL | 0 refills | Status: DC | PRN

## 2020-04-26 ENCOUNTER — Other Ambulatory Visit: Payer: Self-pay | Admitting: Nurse Practitioner

## 2020-05-27 ENCOUNTER — Encounter: Payer: BC Managed Care – PPO | Admitting: Nurse Practitioner

## 2020-07-11 ENCOUNTER — Encounter: Payer: Self-pay | Admitting: Nurse Practitioner

## 2020-07-11 ENCOUNTER — Other Ambulatory Visit: Payer: Self-pay

## 2020-07-11 ENCOUNTER — Ambulatory Visit: Payer: BC Managed Care – PPO | Admitting: Nurse Practitioner

## 2020-07-11 VITALS — BP 142/90 | HR 84 | Temp 98.3°F | Ht 71.6 in | Wt 312.6 lb

## 2020-07-11 DIAGNOSIS — M545 Low back pain, unspecified: Secondary | ICD-10-CM

## 2020-07-11 DIAGNOSIS — M546 Pain in thoracic spine: Secondary | ICD-10-CM

## 2020-07-11 DIAGNOSIS — Z1159 Encounter for screening for other viral diseases: Secondary | ICD-10-CM | POA: Diagnosis not present

## 2020-07-11 DIAGNOSIS — I10 Essential (primary) hypertension: Secondary | ICD-10-CM | POA: Diagnosis not present

## 2020-07-11 DIAGNOSIS — G8929 Other chronic pain: Secondary | ICD-10-CM

## 2020-07-11 LAB — HM HEPATITIS C SCREENING LAB: HM Hepatitis Screen: NEGATIVE

## 2020-07-11 MED ORDER — SALONPAS 3.1-6-10 % EX PTCH: 1.0000 | MEDICATED_PATCH | Freq: Every day | CUTANEOUS | 1 refills | Status: DC

## 2020-07-11 MED ORDER — AMLODIPINE BESYLATE 10 MG PO TABS: 10.0000 mg | ORAL_TABLET | Freq: Every day | ORAL | 1 refills | Status: DC

## 2020-07-11 NOTE — Patient Instructions (Addendum)

## 2020-07-11 NOTE — Progress Notes (Signed)
I,[name] as a Neurosurgeon for [employer], FNP.,have documented all relevant documentation on the behalf of Jacob Felts, FNP,as directed by  Jacob Felts, FNP while in the presence of Jacob Felts, FNP.This visit occurred during the SARS-CoV-2 public health emergency.  Safety protocols were in place, including screening questions prior to the visit, additional usage of staff PPE, and extensive cleaning of exam room while observing appropriate contact time as indicated for disinfecting solutions.  Subjective:     Patient ID: [name] , male    DOB: [birth year] , 39 y.o.   MRN: [id number]   Chief Complaint  Patient presents with  . Hypertension    HPI  Patient here for a b/p check.   Hypertension This is a chronic problem. The current episode started more than 1 year ago. The problem is controlled. Pertinent negatives include no anxiety, blurred vision, chest pain, headaches or palpitations. There are no associated agents to hypertension. Risk factors for coronary artery disease include sedentary lifestyle, male gender and obesity. Past treatments include calcium channel blockers. The current treatment provides no improvement. There are no compliance problems.  There is no history of angina. There is no history of chronic renal disease.     Past Medical History:  Diagnosis Date  . Diabetes mellitus without complication (HCC)      Family History  Family history unknown: Yes     Current Outpatient Medications:  .  amLODipine (NORVASC) 10 MG tablet, Take 1 tablet (10 mg total) by mouth daily., Disp: 90 tablet, Rfl: 1 .  Camphor-Menthol-Methyl Sal (SALONPAS) 3.09-12-08 % PTCH, Apply 1 patch topically daily., Disp: 20 patch, Rfl: 1   No Known Allergies   Review of Systems  Constitutional: Negative.   Eyes: Negative for blurred vision.  Respiratory: Negative.   Cardiovascular: Negative for chest pain, palpitations and leg swelling.  Endocrine: Negative for  polydipsia, polyphagia and polyuria.  Musculoskeletal: Positive for back pain (mid and lower back pain).  Neurological: Negative for dizziness and headaches.  Psychiatric/Behavioral: Negative.      Today's Vitals   07/11/20 1611  BP: (!) 142/90  Pulse: 84  Temp: 98.3 F (36.8 C)  TempSrc: Oral  Weight: (!) 312 lb 9.6 oz (141.8 kg)  Height: 5' 11.6" (1.819 m)  PainSc: 6   PainLoc: Back   Body mass index is 42.87 kg/m.   Objective:  Physical Exam Vitals reviewed.  Constitutional:      General: He is not in acute distress.    Appearance: Normal appearance.  Cardiovascular:     Rate and Rhythm: Normal rate and regular rhythm.     Pulses: Normal pulses.     Heart sounds: Normal heart sounds. No murmur heard.   Pulmonary:     Effort: Pulmonary effort is normal. No respiratory distress.     Breath sounds: Normal breath sounds.  Musculoskeletal:        General: Tenderness (mid to low back pain) present. Normal range of motion.  Skin:    General: Skin is warm and dry.     Capillary Refill: Capillary refill takes less than 2 seconds.  Neurological:     General: No focal deficit present.     Mental Status: He is alert and oriented to person, place, and time.     Cranial Nerves: No cranial nerve deficit.  Psychiatric:        Mood and Affect: Mood normal.        Behavior: Behavior normal.  Thought Content: Thought content normal.        Judgment: Judgment normal.         Assessment And Plan:     1. Essential hypertension  Chronic, his blood pressure is elevated this is likely related to not having his medication.  I have stressed the importance of taking medications regularly and following up routinely.  Also discussed the importance of eating a healthy diet low in salt. - amLODipine (NORVASC) 10 MG tablet; Take 1 tablet (10 mg total) by mouth daily.  Dispense: 90 tablet; Refill: 1 - BASIC METABOLIC PANEL WITH GFR  2. Chronic bilateral low back pain without  sciatica  This has been ongoing and he has not had a baseline xray, I have given him samples of salonpas patches.  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 - DG Lumbar Spine Complete; Future - Camphor-Menthol-Methyl Sal (SALONPAS) 3.09-12-08 % PTCH; Apply 1 patch topically daily.  Dispense: 20 patch; Refill: 1  3. Chronic midline thoracic back pain - DG Thoracic Spine 2 View; Future - Camphor-Menthol-Methyl Sal (SALONPAS) 3.09-12-08 % PTCH; Apply 1 patch topically daily.  Dispense: 20 patch; Refill: 1  4. Encounter for hepatitis C screening test for low risk patient  Will check Hepatitis C screening due to recent recommendations to screen all adults 18 years and older - Hepatitis C antibody     Patient was given opportunity to ask questions. Patient verbalized understanding of the plan and was able to repeat key elements of the plan. All questions were answered to their satisfaction.   Jeanell Sparrow, FNP, have reviewed all documentation for this visit. The documentation on 07/14/20 for the exam, diagnosis, procedures, and orders are all accurate and complete.  THE PATIENT IS ENCOURAGED TO PRACTICE SOCIAL DISTANCING DUE TO THE COVID-19 PANDEMIC.

## 2020-07-12 LAB — BASIC METABOLIC PANEL WITH GFR
BUN: 15 mg/dL (ref 7–25)
CO2: 24 mmol/L (ref 20–32)
Calcium: 9.9 mg/dL (ref 8.6–10.3)
Chloride: 105 mmol/L (ref 98–110)
Creat: 1.1 mg/dL (ref 0.60–1.35)
GFR, Est African American: 99 mL/min/{1.73_m2} (ref >=60)
GFR, Est Non African American: 85 mL/min/{1.73_m2} (ref >=60)
Glucose, Bld: 93 mg/dL (ref 65–99)
Potassium: 4.1 mmol/L (ref 3.5–5.3)
Sodium: 141 mmol/L (ref 135–146)

## 2020-07-14 ENCOUNTER — Encounter: Payer: Self-pay | Admitting: Nurse Practitioner

## 2020-07-15 LAB — HEPATITIS C ANTIBODY
Hepatitis C Ab: NONREACTIVE
SIGNAL TO CUT-OFF: 0.02 (ref <1.00)

## 2020-07-16 ENCOUNTER — Encounter: Payer: Self-pay | Admitting: Nurse Practitioner

## 2020-07-16 ENCOUNTER — Other Ambulatory Visit: Payer: Self-pay

## 2020-07-16 DIAGNOSIS — G8929 Other chronic pain: Secondary | ICD-10-CM

## 2020-07-16 MED ORDER — SALONPAS 3.1-6-10 % EX PTCH: 1.0000 | MEDICATED_PATCH | Freq: Every day | CUTANEOUS | 1 refills | Status: DC

## 2020-09-10 ENCOUNTER — Other Ambulatory Visit: Payer: Self-pay

## 2020-09-10 ENCOUNTER — Encounter: Payer: Self-pay | Admitting: Nurse Practitioner

## 2020-09-10 ENCOUNTER — Ambulatory Visit: Payer: BC Managed Care – PPO | Admitting: Nurse Practitioner

## 2020-09-10 VITALS — BP 140/80 | HR 84 | Temp 98.6°F | Ht 71.8 in | Wt 308.0 lb

## 2020-09-10 DIAGNOSIS — E7800 Pure hypercholesterolemia, unspecified: Secondary | ICD-10-CM | POA: Diagnosis not present

## 2020-09-10 DIAGNOSIS — Z2821 Immunization not carried out because of patient refusal: Secondary | ICD-10-CM

## 2020-09-10 DIAGNOSIS — E559 Vitamin D deficiency, unspecified: Secondary | ICD-10-CM | POA: Diagnosis not present

## 2020-09-10 DIAGNOSIS — Z0000 Encounter for general adult medical examination without abnormal findings: Secondary | ICD-10-CM | POA: Diagnosis not present

## 2020-09-10 DIAGNOSIS — I10 Essential (primary) hypertension: Secondary | ICD-10-CM

## 2020-09-10 DIAGNOSIS — E119 Type 2 diabetes mellitus without complications: Secondary | ICD-10-CM | POA: Diagnosis not present

## 2020-09-10 NOTE — Patient Instructions (Signed)

## 2020-09-10 NOTE — Progress Notes (Signed)
I,[name] as a Neurosurgeon for [employer], FNP.,have documented all relevant documentation on the behalf of [name], FNP,as directed by  [name], FNP while in the presence of [name], FNP. This visit occurred during the SARS-CoV-2 public health emergency.  Safety protocols were in place, including screening questions prior to the visit, additional usage of staff PPE, and extensive cleaning of exam room while observing appropriate contact time as indicated for disinfecting solutions.  Subjective:     Patient ID: [name] , male    DOB: Aug 07, 1981 , 40 y.o.   MRN: [id number]   Chief Complaint  Patient presents with  . Annual Exam    HPI  Here for HM  Wt Readings from Last 3 Encounters: 09/10/20 : (!) 308 lb (139.7 kg) 07/11/20 : (!) 312 lb 9.6 oz (141.8 kg) 01/25/20 : (!) 306 lb 12.8 oz (139.2 kg)     Past Medical History:  Diagnosis Date  . Diabetes mellitus without complication (HCC)      Family History  Family history unknown: Yes     Current Outpatient Medications:  .  amLODipine (NORVASC) 10 MG tablet, Take 1 tablet (10 mg total) by mouth daily., Disp: 90 tablet, Rfl: 1 .  Camphor-Menthol-Methyl Sal (SALONPAS) 3.09-12-08 % PTCH, Apply 1 patch topically daily., Disp: 20 patch, Rfl: 1   No Known Allergies   Men's preventive visit. Patient Health Questionnaire (PHQ-2) is  Flowsheet Row Clinical Support from 11/28/2019 in [facility]  PHQ-2 Total Score 0     Patient is on a Regular diet. He works a strenuous job.  Marital status: Married. Relevant history for alcohol use is:  Social History   Substance and Sexual Activity  Alcohol Use Yes  . Relevant history for tobacco use is:  Social History   Tobacco Use  Smoking Status Never Smoker  Smokeless Tobacco Never Used  .   Review of Systems  Constitutional: Negative.   HENT: Negative.   Eyes: Negative.   Respiratory: Negative.   Cardiovascular:  Negative.   Gastrointestinal: Negative.   Endocrine: Negative.   Genitourinary: Negative.   Musculoskeletal: Negative.   Skin: Negative.   Neurological: Negative.   Hematological: Negative.   Psychiatric/Behavioral: Negative.      Today's Vitals   09/10/20 0943  BP: 140/80  Pulse: 84  Temp: 98.6 F (37 C)  TempSrc: Oral  Weight: (!) 308 lb (139.7 kg)  Height: 5' 11.8" (1.824 m)  PainSc: 0-No pain   Body mass index is 42.01 kg/m.   Objective:  Physical Exam Vitals reviewed.  Constitutional:      General: He is not in acute distress.    Appearance: Normal appearance. He is obese.  HENT:     Head: Normocephalic and atraumatic.     Right Ear: Tympanic membrane, ear canal and external ear normal. There is no impacted cerumen.     Left Ear: Tympanic membrane, ear canal and external ear normal. There is no impacted cerumen.  Cardiovascular:     Rate and Rhythm: Normal rate and regular rhythm.     Pulses: Normal pulses.     Heart sounds: Normal heart sounds. No murmur heard.   Pulmonary:     Effort: Pulmonary effort is normal. No respiratory distress.     Breath sounds: Normal breath sounds.  Abdominal:     General: Abdomen is flat. Bowel sounds are normal. There is no distension.     Palpations: Abdomen is soft.  Genitourinary:  Prostate: Normal.     Rectum: Guaiac result negative.  Musculoskeletal:        General: No swelling or tenderness. Normal range of motion.     Cervical back: Normal range of motion and neck supple.  Skin:    General: Skin is warm.     Capillary Refill: Capillary refill takes less than 2 seconds.  Neurological:     General: No focal deficit present.     Mental Status: He is alert and oriented to person, place, and time.  Psychiatric:        Mood and Affect: Mood normal.        Behavior: Behavior normal.        Thought Content: Thought content normal.        Judgment: Judgment normal.         Assessment And Plan:    1. Encounter  for general adult medical examination w/o abnormal findings . Behavior modifications discussed and diet history reviewed.   . Pt will continue to exercise regularly and modify diet with low GI, plant based foods and decrease intake of processed foods.  . Recommend intake of daily multivitamin, Vitamin D, and calcium.  . Recommend for preventive screenings, as well as recommend immunizations that include influenza (declines), TDAP and pneumonia (he declines having today)  2. Essential hypertension  Chronic, better controlled today  Continue with current medications  EKG done with NSR  Continue to focus on weight loss.   - EKG 12-Lead  3. Vitamin D deficiency  Will check vitamin D level and supplement as needed.     Also encouraged to spend 15 minutes in the sun daily.  - VITAMIN D 25 Hydroxy (Vit-D Deficiency, Fractures)  4. Controlled type 2 diabetes mellitus without complication, without long-term current use of insulin (HCC)  He has been controlled but has not seen ophthalmology will make referral - Ambulatory referral to Ophthalmology - Hemoglobin A1c  5. Elevated cholesterol  Chronic, no current medications  Continue with diet and exercise - Lipid panel  6. COVID-19 vaccination declined  Declines covid 19 vaccine. Discussed risk of covid 24 and if he changes her mind about the vaccine to call the office.  Encouraged to take multivitamin, vitamin d, vitamin c and zinc to increase immune system. Aware can call office if would like to have vaccine here at office.       Patient was given opportunity to ask questions. Patient verbalized understanding of the plan and was able to repeat key elements of the plan. All questions were answered to their satisfaction.   [name], FNP   I, [name], FNP, have reviewed all documentation for this visit. The documentation on 09/10/20 for the exam, diagnosis, procedures, and orders are all accurate and complete.   THE  PATIENT IS ENCOURAGED TO PRACTICE SOCIAL DISTANCING DUE TO THE COVID-19 PANDEMIC.

## 2020-09-11 LAB — LIPID PANEL
Cholesterol: 202 mg/dL — ABNORMAL HIGH (ref <200)
HDL: 63 mg/dL (ref >=40)
LDL Cholesterol (Calc): 117 mg/dL (calc) — ABNORMAL HIGH
Non-HDL Cholesterol (Calc): 139 mg/dL (calc) — ABNORMAL HIGH (ref <130)
Total CHOL/HDL Ratio: 3.2 (calc) (ref <5.0)
Triglycerides: 111 mg/dL (ref <150)

## 2020-09-11 LAB — HEMOGLOBIN A1C
Hgb A1c MFr Bld: 6 % of total Hgb — ABNORMAL HIGH (ref <5.7)
Mean Plasma Glucose: 126 mg/dL
eAG (mmol/L): 7 mmol/L

## 2020-09-11 LAB — VITAMIN D 25 HYDROXY (VIT D DEFICIENCY, FRACTURES): Vit D, 25-Hydroxy: 21 ng/mL — ABNORMAL LOW (ref 30–100)

## 2020-09-26 ENCOUNTER — Other Ambulatory Visit: Payer: Self-pay | Admitting: Nurse Practitioner

## 2020-09-26 DIAGNOSIS — E559 Vitamin D deficiency, unspecified: Secondary | ICD-10-CM

## 2020-09-26 MED ORDER — VITAMIN D (ERGOCALCIFEROL) 1.25 MG (50000 UNIT) PO CAPS: 50000.0000 [IU] | ORAL_CAPSULE | ORAL | 0 refills | Status: DC

## 2020-10-14 ENCOUNTER — Ambulatory Visit: Payer: BC Managed Care – PPO | Admitting: Nurse Practitioner

## 2020-11-13 ENCOUNTER — Other Ambulatory Visit: Payer: Self-pay

## 2020-11-13 ENCOUNTER — Ambulatory Visit: Payer: BC Managed Care – PPO | Admitting: Nurse Practitioner

## 2020-11-13 ENCOUNTER — Encounter: Payer: Self-pay | Admitting: Nurse Practitioner

## 2020-11-13 VITALS — BP 144/82 | HR 81 | Temp 98.1°F | Ht 71.8 in | Wt 318.0 lb

## 2020-11-13 DIAGNOSIS — I10 Essential (primary) hypertension: Secondary | ICD-10-CM | POA: Diagnosis not present

## 2020-11-13 DIAGNOSIS — Z6841 Body Mass Index (BMI) 40.0 and over, adult: Secondary | ICD-10-CM

## 2020-11-13 DIAGNOSIS — M542 Cervicalgia: Secondary | ICD-10-CM | POA: Diagnosis not present

## 2020-11-13 DIAGNOSIS — E119 Type 2 diabetes mellitus without complications: Secondary | ICD-10-CM

## 2020-11-13 LAB — POCT URINALYSIS DIPSTICK
Bilirubin, UA: NEGATIVE
Blood, UA: NEGATIVE
Glucose, UA: NEGATIVE
Ketones, UA: NEGATIVE
Leukocytes, UA: NEGATIVE
Nitrite, UA: NEGATIVE
Protein, UA: NEGATIVE
Spec Grav, UA: >=1.03 — AB (ref 1.010–1.025)
Urobilinogen, UA: 0.2 E.U./dL
pH, UA: 6 (ref 5.0–8.0)

## 2020-11-13 LAB — POCT UA - MICROALBUMIN
Albumin/Creatinine Ratio, Urine, POC: 30
Creatinine, POC: 300 mg/dL
Microalbumin Ur, POC: 30 mg/L

## 2020-11-13 MED ORDER — MAGNESIUM 250 MG PO TABS: 1.0000 | ORAL_TABLET | Freq: Every day | ORAL | 1 refills | Status: DC

## 2020-11-13 NOTE — Progress Notes (Signed)
Tomasa Hose as a scribe for [name], FNP.,have documented all relevant documentation on the behalf of [name], FNP,as directed by  [name], FNP while in the presence of [name], FNP. This visit occurred during the SARS-CoV-2 public health emergency.  Safety protocols were in place, including screening questions prior to the visit, additional usage of staff PPE, and extensive cleaning of exam room while observing appropriate contact time as indicated for disinfecting solutions.  Subjective:     Patient ID: [name] , male    DOB: [birth year] , 40 y.o.   MRN: [id number]   Chief Complaint  Patient presents with  . Hypertension    HPI  Patient presents today for a blood pressure f/u. He is currently taking Amlodipine and is tolerating it well.  Wt Readings from Last 3 Encounters: 11/13/20 : (!) 318 lb (144.2 kg) 09/10/20 : (!) 308 lb (139.7 kg) 07/11/20 : (!) 312 lb 9.6 oz (141.8 kg)  Hypertension This is a chronic problem. The current episode started more than 1 year ago. The problem is controlled. Pertinent negatives include no anxiety, blurred vision, chest pain, headaches or palpitations. There are no associated agents to hypertension. Risk factors for coronary artery disease include sedentary lifestyle, male gender and obesity. Past treatments include calcium channel blockers. The current treatment provides no improvement. There are no compliance problems.  There is no history of angina. There is no history of chronic renal disease.     Past Medical History:  Diagnosis Date  . Diabetes mellitus without complication (HCC)      Family History  Family history unknown: Yes     Current Outpatient Medications:  .  amLODipine (NORVASC) 10 MG tablet, Take 1 tablet (10 mg total) by mouth daily., Disp: 90 tablet, Rfl: 1 .  Camphor-Menthol-Methyl Sal (SALONPAS) 3.09-12-08 % PTCH, Apply 1 patch topically daily., Disp: 20 patch, Rfl: 1 .  Magnesium 250 MG  TABS, Take 1 tablet (250 mg total) by mouth daily. With evening meal, Disp: 90 tablet, Rfl: 1 .  Vitamin D, Ergocalciferol, (DRISDOL) 1.25 MG (50000 UNIT) CAPS capsule, Take 1 capsule (50,000 Units total) by mouth every 7 (seven) days., Disp: 12 capsule, Rfl: 0   No Known Allergies   Review of Systems  Constitutional: Negative.   HENT: Negative.   Eyes: Negative for blurred vision.  Respiratory: Negative.   Cardiovascular: Negative.  Negative for chest pain, palpitations and leg swelling.  Endocrine: Negative for polydipsia, polyphagia and polyuria.  Musculoskeletal: Negative.   Skin: Negative.   Neurological: Negative for dizziness and headaches.  Psychiatric/Behavioral: Negative.      Today's Vitals   11/13/20 1417  BP: (!) 144/82  Pulse: 81  Temp: 98.1 F (36.7 C)  TempSrc: Oral  Weight: (!) 318 lb (144.2 kg)  Height: 5' 11.8" (1.824 m)  PainSc: 0-No pain   Body mass index is 43.37 kg/m.   Objective:  Physical Exam Vitals reviewed.  Constitutional:      General: He is not in acute distress.    Appearance: Normal appearance. He is obese.  HENT:     Head: Normocephalic.  Eyes:     Extraocular Movements: Extraocular movements intact.     Conjunctiva/sclera: Conjunctivae normal.     Pupils: Pupils are equal, round, and reactive to light.  Cardiovascular:     Rate and Rhythm: Normal rate and regular rhythm.     Pulses: Normal pulses.     Heart sounds: Normal heart sounds. No murmur heard.  Pulmonary:     Effort: Pulmonary effort is normal. No respiratory distress.     Breath sounds: Normal breath sounds. No wheezing.  Skin:    General: Skin is warm and dry.     Capillary Refill: Capillary refill takes less than 2 seconds.  Neurological:     General: No focal deficit present.     Mental Status: He is alert and oriented to person, place, and time.     Cranial Nerves: No cranial nerve deficit.  Psychiatric:        Mood and Affect: Mood normal.         Behavior: Behavior normal.        Thought Content: Thought content normal.        Judgment: Judgment normal.         Assessment And Plan:     1. Essential hypertension  Chronic, blood pressure is fairly controlled  Will send for sleep study as if he has sleep apnea improving this will help improve his blood pressure - Ambulatory referral to Sleep Studies - Magnesium 250 MG TABS; Take 1 tablet (250 mg total) by mouth daily. With evening meal  Dispense: 90 tablet; Refill: 1  2. Class 3 severe obesity with serious comorbidity and body mass index (BMI) of 40.0 to 44.9 in adult, unspecified obesity type (HCC)  Will refer for sleep study as this may help with weight loss - Ambulatory referral to Sleep Studies  3. Controlled type 2 diabetes mellitus without complication, without long-term current use of insulin (HCC)  No current medications  Has not had an eye exam  4. Neck pain  He has tense neck muscles   Encouraged to have massage therapy to loosen his muscles     Patient was given opportunity to ask questions. Patient verbalized understanding of the plan and was able to repeat key elements of the plan. All questions were answered to their satisfaction.  [name], FNP   I, [name], FNP, have reviewed all documentation for this visit. The documentation on 11/13/20 for the exam, diagnosis, procedures, and orders are all accurate and complete.   THE PATIENT IS ENCOURAGED TO PRACTICE SOCIAL DISTANCING DUE TO THE COVID-19 PANDEMIC.

## 2020-11-13 NOTE — Patient Instructions (Signed)

## 2020-12-09 ENCOUNTER — Ambulatory Visit: Payer: BC Managed Care – PPO | Admitting: Nurse Practitioner

## 2020-12-16 IMAGING — DX RIGHT TIBIA AND FIBULA - 2 VIEW
4 series · 4 of 4 positions shown · non-contrast
Comparison: None.

CLINICAL DATA: Patient fell 3 weeks ago suffering a laceration with
persistent pain.

EXAM:
RIGHT TIBIA AND FIBULA - 2 VIEW

[tibia ap (1 of 2)]
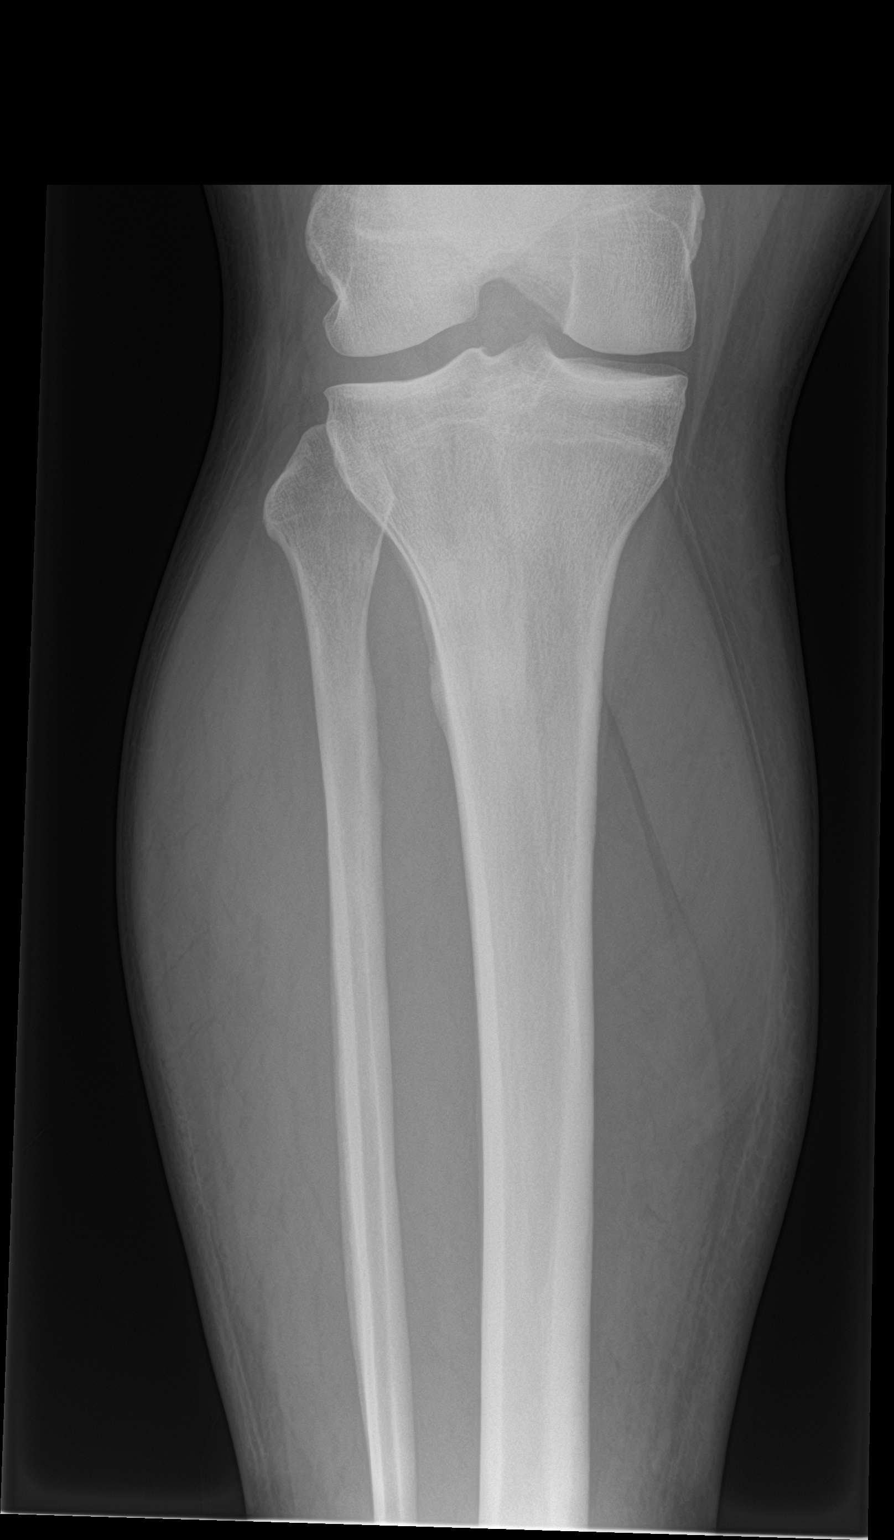

[tibia ap (2 of 2)]
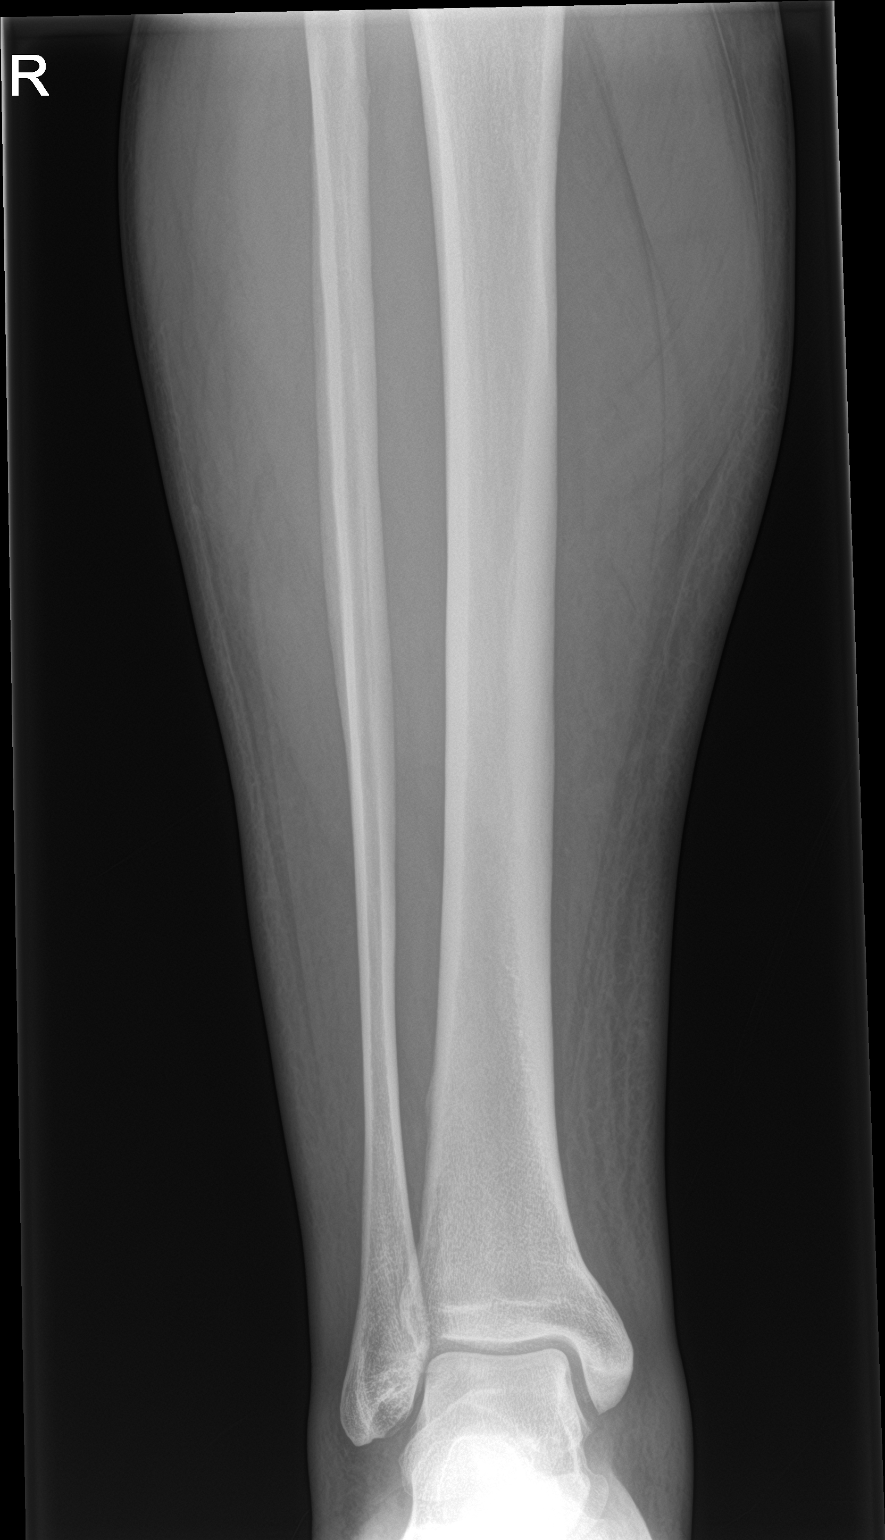

[tibia lat (1 of 2)]
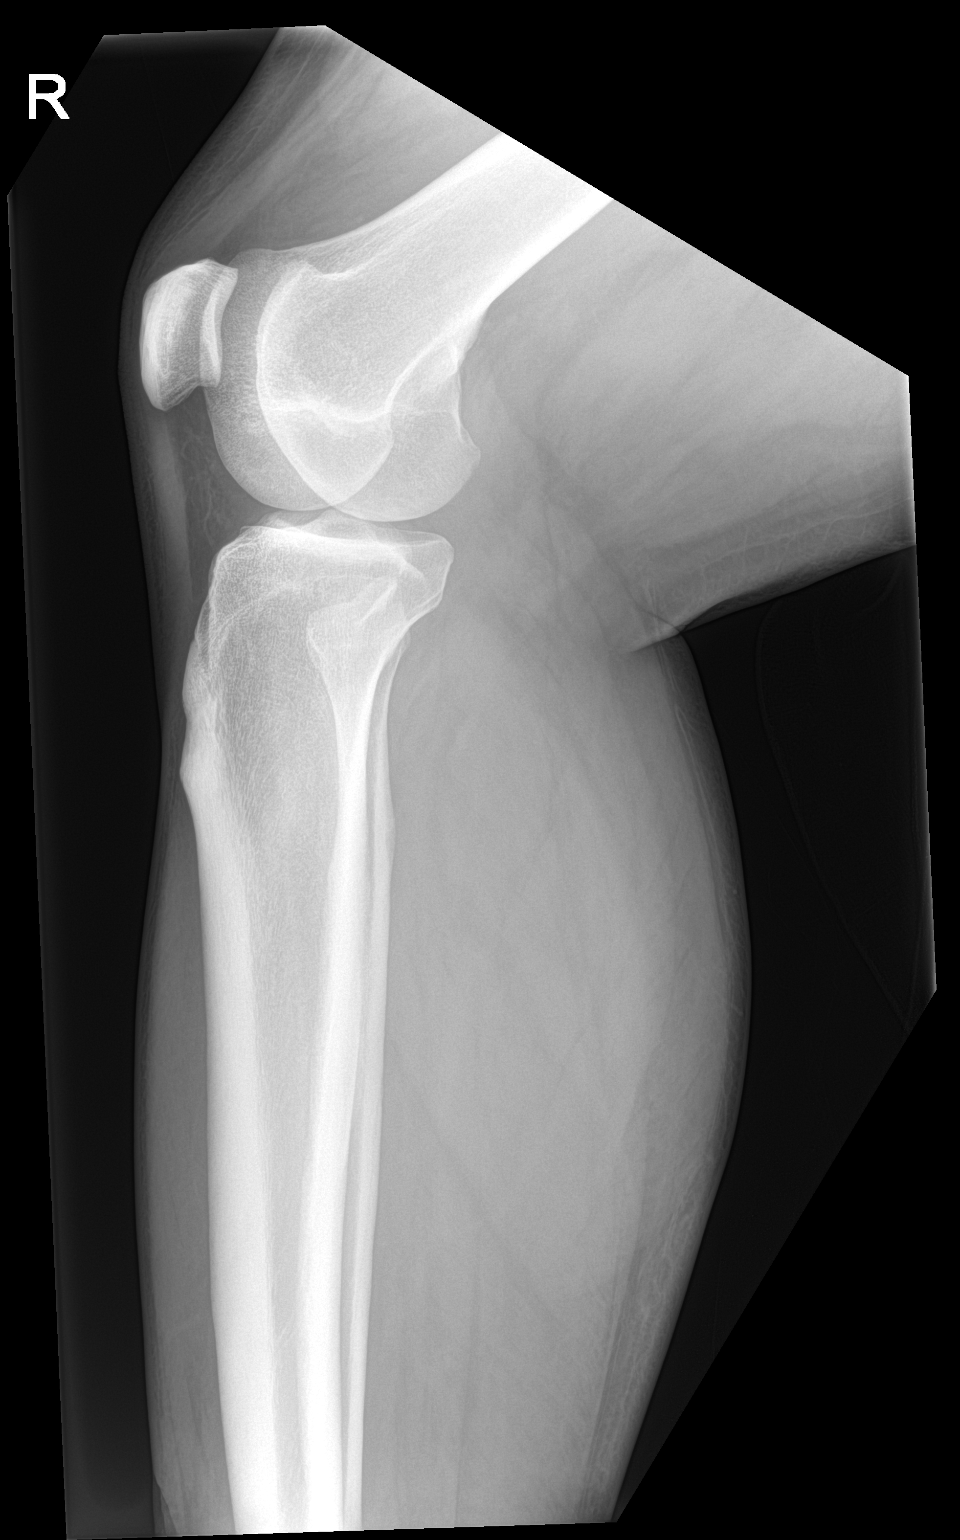

[tibia lat (2 of 2)]
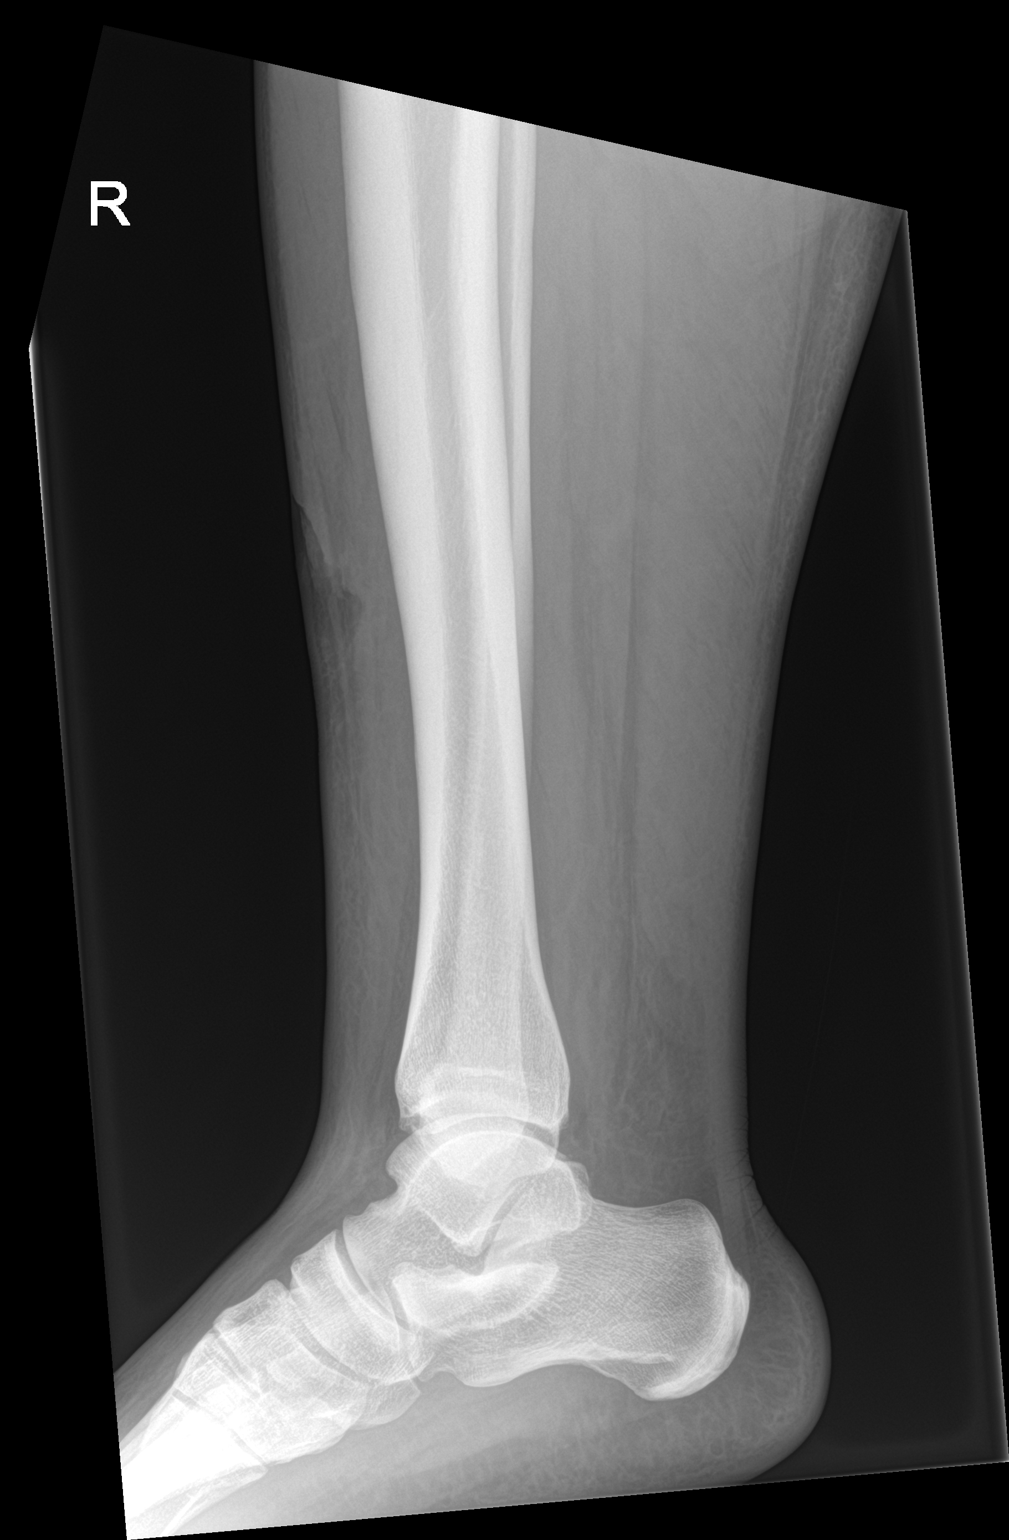

[4 of 4 positions shown; findings below may reference images not displayed]

FINDINGS: There is an apparent laceration about the anterior aspect of the
lower leg/shin. No associated fracture or radiopaque foreign body.
No discrete areas of osteolysis to suggest osteomyelitis. Limited
visualization of the adjacent knee and ankle is normal given
obliquity and large field of view.
IMPRESSION: Laceration about the anterior aspect of the shin without associated
fracture, radiopaque foreign body or radiographic evidence of
osteomyelitis.

## 2021-03-18 ENCOUNTER — Other Ambulatory Visit: Payer: Self-pay

## 2021-03-18 ENCOUNTER — Ambulatory Visit: Payer: BC Managed Care – PPO | Admitting: Nurse Practitioner

## 2021-03-18 ENCOUNTER — Encounter: Payer: Self-pay | Admitting: Nurse Practitioner

## 2021-03-18 VITALS — BP 134/80 | HR 94 | Temp 98.9°F | Ht 71.2 in | Wt 304.4 lb

## 2021-03-18 DIAGNOSIS — E7800 Pure hypercholesterolemia, unspecified: Secondary | ICD-10-CM

## 2021-03-18 DIAGNOSIS — I10 Essential (primary) hypertension: Secondary | ICD-10-CM | POA: Diagnosis not present

## 2021-03-18 DIAGNOSIS — E559 Vitamin D deficiency, unspecified: Secondary | ICD-10-CM

## 2021-03-18 DIAGNOSIS — Z6841 Body Mass Index (BMI) 40.0 and over, adult: Secondary | ICD-10-CM

## 2021-03-18 DIAGNOSIS — E119 Type 2 diabetes mellitus without complications: Secondary | ICD-10-CM | POA: Diagnosis not present

## 2021-03-18 NOTE — Patient Instructions (Signed)

## 2021-03-18 NOTE — Progress Notes (Signed)
I,Tianna Badgett,acting as a Neurosurgeon for [employer], FNP.,have documented all relevant documentation on the behalf of [name], FNP,as directed by  [name], FNP while in the presence of [name], FNP.  This visit occurred during the SARS-CoV-2 public health emergency.  Safety protocols were in place, including screening questions prior to the visit, additional usage of staff PPE, and extensive cleaning of exam room while observing appropriate contact time as indicated for disinfecting solutions.  Subjective:     Patient ID: [name] , male    DOB: 12/24/1980 , 40 y.o.   MRN: [id number]   Chief Complaint  Patient presents with   Diabetes   Hypertension   vitamin d recheck    HPI  Patient here for a b/p check.  Wt Readings from Last 3 Encounters: 03/18/21 : (!) 304 lb 6.4 oz (138.1 kg) 11/13/20 : (!) 318 lb (144.2 kg) 09/10/20 : (!) 308 lb (139.7 kg)    Diabetes Pertinent negatives for hypoglycemia include no headaches. Pertinent negatives for diabetes include no blurred vision and no chest pain.  Hypertension This is a chronic problem. The current episode started more than 1 year ago. The problem is controlled. Pertinent negatives include no anxiety, blurred vision, chest pain, headaches or palpitations. There are no associated agents to hypertension. Risk factors for coronary artery disease include sedentary lifestyle, male gender and obesity. Past treatments include calcium channel blockers. The current treatment provides no improvement. There are no compliance problems.  There is no history of angina. There is no history of chronic renal disease.    Past Medical History:  Diagnosis Date   Diabetes mellitus without complication (HCC)      Family History  Family history unknown: Yes     Current Outpatient Medications:    amLODipine (NORVASC) 10 MG tablet, Take 1 tablet (10 mg total) by mouth daily., Disp: 90 tablet, Rfl: 1   Camphor-Menthol-Methyl Sal  (SALONPAS) 3.09-12-08 % PTCH, Apply 1 patch topically daily., Disp: 20 patch, Rfl: 1   Magnesium 250 MG TABS, Take 1 tablet (250 mg total) by mouth daily. With evening meal (Patient not taking: Reported on 03/18/2021), Disp: 90 tablet, Rfl: 1   Vitamin D, Ergocalciferol, (DRISDOL) 1.25 MG (50000 UNIT) CAPS capsule, Take 1 capsule (50,000 Units total) by mouth every 7 (seven) days. (Patient not taking: Reported on 03/18/2021), Disp: 12 capsule, Rfl: 0   No Known Allergies   Review of Systems  Constitutional: Negative.   Eyes:  Negative for blurred vision.  Respiratory: Negative.    Cardiovascular: Negative.  Negative for chest pain, palpitations and leg swelling.  Gastrointestinal: Negative.   Musculoskeletal: Negative.  Negative for arthralgias.  Neurological: Negative.  Negative for headaches.  Psychiatric/Behavioral: Negative.      Today's Vitals   03/18/21 1151  BP: 134/80  Pulse: 94  Temp: 98.9 F (37.2 C)  Weight: (!) 304 lb 6.4 oz (138.1 kg)  Height: 5' 11.2" (1.808 m)  PainSc: 0-No pain   Body mass index is 42.22 kg/m.   Objective:  Physical Exam Vitals reviewed.  Constitutional:      General: He is not in acute distress.    Appearance: Normal appearance. He is obese.  Eyes:     Extraocular Movements: Extraocular movements intact.     Pupils: Pupils are equal, round, and reactive to light.  Cardiovascular:     Rate and Rhythm: Normal rate and regular rhythm.     Pulses: Normal pulses.     Heart sounds:  Normal heart sounds. No murmur heard. Pulmonary:     Effort: Pulmonary effort is normal. No respiratory distress.     Breath sounds: Normal breath sounds. No wheezing.  Skin:    General: Skin is warm and dry.     Capillary Refill: Capillary refill takes less than 2 seconds.  Neurological:     General: No focal deficit present.     Mental Status: He is alert and oriented to person, place, and time.     Cranial Nerves: No cranial nerve deficit.  Psychiatric:         Mood and Affect: Mood normal.        Behavior: Behavior normal.        Thought Content: Thought content normal.        Judgment: Judgment normal.        Assessment And Plan:     1. Essential hypertension Comments: Fair control, continue current medications Has improved - COMPLETE METABOLIC PANEL WITH GFR  2. Controlled type 2 diabetes mellitus without complication, without long-term current use of insulin (HCC) Comments: HgbA1c increased to 6, encouraged to eat healthy diet and regularly exercise - Hemoglobin A1c - COMPLETE METABOLIC PANEL WITH GFR  3. Vitamin D deficiency Will check vitamin D level and supplement as needed.    Also encouraged to spend 15 minutes in the sun daily.   4. Elevated cholesterol Comments: No current medications, will check lipid panel - Lipid panel  5. Class 3 severe obesity with serious comorbidity and body mass index (BMI) of 40.0 to 44.9 in adult, unspecified obesity type (HCC) Chronic Discussed healthy diet and regular exercise options  Encouraged to exercise at least 150 minutes per week with 2 days of strength training Congratulated on his weight loss    Patient was given opportunity to ask questions. Patient verbalized understanding of the plan and was able to repeat key elements of the plan. All questions were answered to their satisfaction.  [name], FNP   I, [name], FNP, have reviewed all documentation for this visit. The documentation on 03/18/21 for the exam, diagnosis, procedures, and orders are all accurate and complete.   IF YOU HAVE BEEN REFERRED TO A SPECIALIST, IT MAY TAKE 1-2 WEEKS TO SCHEDULE/PROCESS THE REFERRAL. IF YOU HAVE NOT HEARD FROM US/SPECIALIST IN TWO WEEKS, PLEASE GIVE [place] A CALL AT [phone].   THE PATIENT IS ENCOURAGED TO PRACTICE SOCIAL DISTANCING DUE TO THE COVID-19 PANDEMIC.

## 2021-03-19 LAB — LIPID PANEL
Cholesterol: 197 mg/dL (ref <200)
HDL: 50 mg/dL (ref >=40)
LDL Cholesterol (Calc): 124 mg/dL (calc) — ABNORMAL HIGH
Non-HDL Cholesterol (Calc): 147 mg/dL (calc) — ABNORMAL HIGH (ref <130)
Total CHOL/HDL Ratio: 3.9 (calc) (ref <5.0)
Triglycerides: 120 mg/dL (ref <150)

## 2021-03-19 LAB — COMPLETE METABOLIC PANEL WITH GFR
AG Ratio: 1.8 (calc) (ref 1.0–2.5)
ALT: 18 U/L (ref 9–46)
AST: 21 U/L (ref 10–40)
Albumin: 4.9 g/dL (ref 3.6–5.1)
Alkaline phosphatase (APISO): 59 U/L (ref 36–130)
BUN: 16 mg/dL (ref 7–25)
CO2: 24 mmol/L (ref 20–32)
Calcium: 9.6 mg/dL (ref 8.6–10.3)
Chloride: 104 mmol/L (ref 98–110)
Creat: 1.24 mg/dL (ref 0.60–1.26)
Globulin: 2.8 g/dL (calc) (ref 1.9–3.7)
Glucose, Bld: 111 mg/dL — ABNORMAL HIGH (ref 65–99)
Potassium: 4 mmol/L (ref 3.5–5.3)
Sodium: 139 mmol/L (ref 135–146)
Total Bilirubin: 0.5 mg/dL (ref 0.2–1.2)
Total Protein: 7.7 g/dL (ref 6.1–8.1)
eGFR: 77 mL/min/{1.73_m2} (ref >=60)

## 2021-03-19 LAB — HEMOGLOBIN A1C
Hgb A1c MFr Bld: 6.1 % of total Hgb — ABNORMAL HIGH (ref <5.7)
Mean Plasma Glucose: 128 mg/dL
eAG (mmol/L): 7.1 mmol/L

## 2021-07-09 ENCOUNTER — Ambulatory Visit: Payer: BC Managed Care – PPO | Admitting: Nurse Practitioner

## 2021-07-16 ENCOUNTER — Encounter: Payer: Self-pay | Admitting: Nurse Practitioner

## 2021-07-16 ENCOUNTER — Ambulatory Visit (INDEPENDENT_AMBULATORY_CARE_PROVIDER_SITE_OTHER): Payer: BC Managed Care – PPO | Admitting: Nurse Practitioner

## 2021-07-16 ENCOUNTER — Other Ambulatory Visit: Payer: Self-pay

## 2021-07-16 VITALS — BP 132/98 | HR 85 | Temp 98.1°F | Ht 71.0 in | Wt 310.8 lb

## 2021-07-16 DIAGNOSIS — Z2821 Immunization not carried out because of patient refusal: Secondary | ICD-10-CM

## 2021-07-16 DIAGNOSIS — R030 Elevated blood-pressure reading, without diagnosis of hypertension: Secondary | ICD-10-CM | POA: Diagnosis not present

## 2021-07-16 DIAGNOSIS — Z6841 Body Mass Index (BMI) 40.0 and over, adult: Secondary | ICD-10-CM

## 2021-07-16 DIAGNOSIS — Z113 Encounter for screening for infections with a predominantly sexual mode of transmission: Secondary | ICD-10-CM

## 2021-07-16 MED ORDER — METRONIDAZOLE 500 MG PO TABS
500.0000 mg | ORAL_TABLET | Freq: Three times a day (TID) | ORAL | 0 refills | Status: AC
Start: 2021-07-16 — End: 2021-07-23

## 2021-07-16 NOTE — Progress Notes (Signed)
I,Yamilka J Llittleton,acting as a Neurosurgeon for [employer], FNP.,have documented all relevant documentation on the behalf of [name], FNP,as directed by  [name], FNP while in the presence of [name], FNP.  This visit occurred during the SARS-CoV-2 public health emergency.  Safety protocols were in place, including screening questions prior to the visit, additional usage of staff PPE, and extensive cleaning of exam room while observing appropriate contact time as indicated for disinfecting solutions.  Subjective:     Patient ID: [name] , male    DOB: [birth year] , 40 y.o.   MRN: [id number]   Chief Complaint  Patient presents with   Exposure to STD    HPI  Patient presents today for std testing. He stated his wife told him she has trich and his needs to come here to be checked.   Wt Readings from Last 3 Encounters: 07/16/21 : (!) 310 lb 12.8 oz (141 kg) 03/18/21 : (!) 304 lb 6.4 oz (138.1 kg) 11/13/20 : (!) 318 lb (144.2 kg)    Exposure to STD This is a new problem. Pertinent negatives include no abdominal pain, coughing or headaches. The treatment provided no relief. He is sexually active. He never uses condoms. No, his partner does not have an STD.    Past Medical History:  Diagnosis Date   Diabetes mellitus without complication (HCC)      Family History  Family history unknown: Yes     Current Outpatient Medications:    amLODipine (NORVASC) 10 MG tablet, Take 1 tablet (10 mg total) by mouth daily., Disp: 90 tablet, Rfl: 1   metroNIDAZOLE (FLAGYL) 500 MG tablet, Take 1 tablet (500 mg total) by mouth 3 (three) times daily for 7 days., Disp: 14 tablet, Rfl: 0   Camphor-Menthol-Methyl Sal (SALONPAS) 3.09-12-08 % PTCH, Apply 1 patch topically daily. (Patient not taking: Reported on 07/16/2021), Disp: 20 patch, Rfl: 1   Magnesium 250 MG TABS, Take 1 tablet (250 mg total) by mouth daily. With evening meal (Patient not taking: No sig reported), Disp: 90 tablet, Rfl:  1   Vitamin D, Ergocalciferol, (DRISDOL) 1.25 MG (50000 UNIT) CAPS capsule, Take 1 capsule (50,000 Units total) by mouth every 7 (seven) days. (Patient not taking: No sig reported), Disp: 12 capsule, Rfl: 0   No Known Allergies   Review of Systems  Constitutional: Negative.   Respiratory: Negative.  Negative for cough.   Cardiovascular: Negative.   Gastrointestinal:  Negative for abdominal pain.  Genitourinary: Negative.   Neurological: Negative.  Negative for headaches.  Psychiatric/Behavioral: Negative.      Today's Vitals   07/16/21 1147  BP: (!) 132/98  Pulse: 85  Temp: 98.1 F (36.7 C)  Weight: (!) 310 lb 12.8 oz (141 kg)  Height: 5\' 11"  (1.803 m)  PainSc: 0-No pain   Body mass index is 43.35 kg/m.   Objective:  Physical Exam Vitals reviewed.  Constitutional:      General: He is not in acute distress.    Appearance: Normal appearance. He is obese.  Pulmonary:     Effort: Pulmonary effort is normal. No respiratory distress.  Neurological:     General: No focal deficit present.     Mental Status: He is alert and oriented to person, place, and time.  Psychiatric:        Mood and Affect: Mood normal.        Behavior: Behavior normal.        Thought Content: Thought content normal.  Judgment: Judgment normal.        Assessment And Plan:     1. Screening for STD (sexually transmitted disease) Comments: Will treat for possible trichomoniasis since his wife tested positive. Declines HIV and Syphyllis testing - Urine cytology ancillary only - metroNIDAZOLE (FLAGYL) 500 MG tablet; Take 1 tablet (500 mg total) by mouth 3 (three) times daily for 7 days.  Dispense: 14 tablet; Refill: 0  2. Elevated blood-pressure reading without diagnosis of hypertension Comments: Feels this is related to him doing a lot of running around today. No changes  3. Influenza vaccination declined Patient declined influenza vaccination at this time. Patient is aware that influenza  vaccine prevents illness in 70% of healthy people, and reduces hospitalizations to 30-70% in elderly. This vaccine is recommended annually. Pt is willing to accept risk associated with refusing vaccination.  4. COVID-19 vaccination declined  5. Class 3 severe obesity with serious comorbidity and body mass index (BMI) of 40.0 to 44.9 in adult, unspecified obesity type (HCC)  Continue with healthy diet and regular exercise  Patient was given opportunity to ask questions. Patient verbalized understanding of the plan and was able to repeat key elements of the plan. All questions were answered to their satisfaction.  [name], FNP   I, [name], FNP, have reviewed all documentation for this visit. The documentation on 07/16/21 for the exam, diagnosis, procedures, and orders are all accurate and complete.   IF YOU HAVE BEEN REFERRED TO A SPECIALIST, IT MAY TAKE 1-2 WEEKS TO SCHEDULE/PROCESS THE REFERRAL. IF YOU HAVE NOT HEARD FROM US/SPECIALIST IN TWO WEEKS, PLEASE GIVE [place] A CALL AT [phone].   THE PATIENT IS ENCOURAGED TO PRACTICE SOCIAL DISTANCING DUE TO THE COVID-19 PANDEMIC.

## 2021-07-17 ENCOUNTER — Other Ambulatory Visit: Payer: Self-pay | Admitting: Nurse Practitioner

## 2021-07-17 DIAGNOSIS — Z113 Encounter for screening for infections with a predominantly sexual mode of transmission: Secondary | ICD-10-CM

## 2021-07-22 ENCOUNTER — Ambulatory Visit: Payer: BC Managed Care – PPO | Admitting: Nurse Practitioner

## 2021-07-24 LAB — TRICHOMONAS VAGINALIS RNA, QL,MALES: Trichomonas vaginalis RNA: NOT DETECTED

## 2021-10-06 ENCOUNTER — Encounter: Payer: Self-pay | Admitting: Nurse Practitioner

## 2021-10-27 ENCOUNTER — Other Ambulatory Visit: Payer: Self-pay

## 2021-10-27 DIAGNOSIS — I10 Essential (primary) hypertension: Secondary | ICD-10-CM

## 2021-10-27 MED ORDER — AMLODIPINE BESYLATE 10 MG PO TABS: 10.0000 mg | ORAL_TABLET | Freq: Every day | ORAL | 1 refills | Status: DC

## 2021-11-24 ENCOUNTER — Ambulatory Visit: Payer: BC Managed Care – PPO | Admitting: Nurse Practitioner

## 2021-12-01 ENCOUNTER — Encounter: Payer: Self-pay | Admitting: Nurse Practitioner

## 2021-12-01 ENCOUNTER — Ambulatory Visit: Payer: BC Managed Care – PPO | Admitting: Nurse Practitioner

## 2021-12-01 ENCOUNTER — Other Ambulatory Visit: Payer: Self-pay

## 2021-12-01 VITALS — BP 128/70 | HR 88 | Temp 98.3°F | Ht 71.0 in | Wt 301.0 lb

## 2021-12-01 DIAGNOSIS — E7800 Pure hypercholesterolemia, unspecified: Secondary | ICD-10-CM

## 2021-12-01 DIAGNOSIS — I10 Essential (primary) hypertension: Secondary | ICD-10-CM

## 2021-12-01 DIAGNOSIS — E1169 Type 2 diabetes mellitus with other specified complication: Secondary | ICD-10-CM | POA: Diagnosis not present

## 2021-12-01 DIAGNOSIS — G8929 Other chronic pain: Secondary | ICD-10-CM

## 2021-12-01 DIAGNOSIS — E119 Type 2 diabetes mellitus without complications: Secondary | ICD-10-CM

## 2021-12-01 DIAGNOSIS — R21 Rash and other nonspecific skin eruption: Secondary | ICD-10-CM

## 2021-12-01 DIAGNOSIS — Z114 Encounter for screening for human immunodeficiency virus [HIV]: Secondary | ICD-10-CM

## 2021-12-01 DIAGNOSIS — E559 Vitamin D deficiency, unspecified: Secondary | ICD-10-CM

## 2021-12-01 DIAGNOSIS — L608 Other nail disorders: Secondary | ICD-10-CM

## 2021-12-01 DIAGNOSIS — M545 Low back pain, unspecified: Secondary | ICD-10-CM

## 2021-12-01 LAB — POCT URINALYSIS DIPSTICK
Bilirubin, UA: NEGATIVE
Blood, UA: NEGATIVE
Glucose, UA: NEGATIVE
Ketones, UA: NEGATIVE
Leukocytes, UA: NEGATIVE
Nitrite, UA: NEGATIVE
Protein, UA: NEGATIVE
Spec Grav, UA: >=1.03 — AB (ref 1.010–1.025)
Urobilinogen, UA: 0.2 E.U./dL
pH, UA: 6 (ref 5.0–8.0)

## 2021-12-01 MED ORDER — LAMISIL AT SPRAY 1 % EX SOLN: 2.0000 | Freq: Two times a day (BID) | CUTANEOUS | 1 refills | Status: DC

## 2021-12-01 MED ORDER — MOMETASONE FUROATE 0.1 % EX CREA: 1.0000 "application " | TOPICAL_CREAM | Freq: Every day | CUTANEOUS | 0 refills | Status: DC

## 2021-12-01 NOTE — Progress Notes (Signed)
?This visit occurred during the SARS-CoV-2 public health emergency.  Safety protocols were in place, including screening questions prior to the visit, additional usage of staff PPE, and extensive cleaning of exam room while observing appropriate contact time as indicated for disinfecting solutions. ? ?Subjective:  ?  ? Patient ID: [name] , male    DOB: 1981/07/07 , 41 y.o.   MRN: [id number] ? ? ?Chief Complaint  ?Patient presents with  ? Diabetes  ? Hypertension  ? ? ?HPI ? ?Patient here for a b/p check. ? ?Wt Readings from Last 3 Encounters: ?03/18/21 : (!) 304 lb 6.4 oz (138.1 kg) ?11/13/20 : (!) 318 lb (144.2 kg) ?09/10/20 : (!) 308 lb (139.7 kg) ? ? ? ?Diabetes ?He presents for his follow-up diabetic visit. Pertinent negatives for hypoglycemia include no headaches. Pertinent negatives for diabetes include no blurred vision and no chest pain.  ?Hypertension ?This is a chronic problem. The current episode started more than 1 year ago. The problem is controlled. Pertinent negatives include no anxiety, blurred vision, chest pain, headaches or palpitations. There are no associated agents to hypertension. Risk factors for coronary artery disease include sedentary lifestyle, male gender and obesity. Past treatments include calcium channel blockers. The current treatment provides no improvement. There are no compliance problems.  There is no history of angina. There is no history of chronic renal disease.   ? ?Past Medical History:  ?Diagnosis Date  ? Diabetes mellitus without complication (HCC)   ?  ? ?Family History  ?Family history unknown: Yes  ? ? ? ?Current Outpatient Medications:  ?  amLODipine (NORVASC) 10 MG tablet, Take 1 tablet (10 mg total) by mouth daily., Disp: 90 tablet, Rfl: 1 ?  mometasone (ELOCON) 0.1 % cream, Apply 1 application. topically daily., Disp: 45 g, Rfl: 0 ?  Terbinafine HCl (LAMISIL AT SPRAY) 1 % SOLN, Apply 2 sprays topically 2 (two) times daily., Disp: 125 mL, Rfl: 1  ? ?No Known  Allergies  ? ?Review of Systems  ?Eyes:  Negative for blurred vision.  ?Cardiovascular:  Negative for chest pain and palpitations.  ?Musculoskeletal:  Positive for back pain (low back pain). Negative for arthralgias.  ?Neurological:  Negative for headaches.   ? ?Today's Vitals  ? 12/01/21 1454  ?BP: 128/70  ?Pulse: 88  ?Temp: 98.3 ?F (36.8 ?C)  ?TempSrc: Oral  ?Weight: (!) 301 lb (136.5 kg)  ?Height: 5\' 11"  (1.803 m)  ? ?Body mass index is 41.98 kg/m?.  ? ?Objective:  ?Physical Exam ?Vitals reviewed.  ?Constitutional:   ?   General: He is not in acute distress. ?   Appearance: Normal appearance. He is obese.  ?Eyes:  ?   Extraocular Movements: Extraocular movements intact.  ?   Pupils: Pupils are equal, round, and reactive to light.  ?Cardiovascular:  ?   Rate and Rhythm: Normal rate and regular rhythm.  ?   Pulses: Normal pulses.  ?   Heart sounds: Normal heart sounds. No murmur heard. ?Pulmonary:  ?   Effort: Pulmonary effort is normal. No respiratory distress.  ?   Breath sounds: Normal breath sounds. No wheezing.  ?Skin: ?   General: Skin is warm and dry.  ?   Capillary Refill: Capillary refill takes less than 2 seconds.  ?   Comments: Yellow color to toenails  ?Neurological:  ?   General: No focal deficit present.  ?   Mental Status: He is alert and oriented to person, place, and time.  ?  Cranial Nerves: No cranial nerve deficit.  ?Psychiatric:     ?   Mood and Affect: Mood normal.     ?   Behavior: Behavior normal.     ?   Thought Content: Thought content normal.     ?   Judgment: Judgment normal.  ?  ? ?   ?Assessment And Plan:  ?   ?1. Essential hypertension ?Comments: Blood pressure is normal, no current medications ?- POCT Urinalysis Dipstick (25003) ?- COMPLETE METABOLIC PANEL WITH GFR ?- Microalbumin / Creatinine Urine Ratio ? ?2. Controlled type 2 diabetes mellitus without complication, without long-term current use of insulin (HCC) ?Comments: No current medications. Diet controlled. Encouraged to  focus on healthy diet and regular exercise ?- Hemoglobin A1c ?- COMPLETE METABOLIC PANEL WITH GFR ? ?3. Vitamin D deficiency ?Will check vitamin D level and supplement as needed.    ?Also encouraged to spend 15 minutes in the sun daily.  ? ?4. Elevated cholesterol ?Comments: slightly elevated LDL, encouraged to eat a low fat diet. ?- Lipid panel ? ?5. Screening for HIV without presence of risk factors ?- HIV Antibody (routine testing w rflx) ? ?6. Chronic bilateral low back pain without sciatica ?Comments: She has been complaining of back pain for some time but has not had xrays, will have lumbar spine xrays done ?- DG Lumbar Spine Complete; Future ? ?7. Rash and nonspecific skin eruption ?Comments: hyperpigemented area to right arm scaly appearance ?- mometasone (ELOCON) 0.1 % cream; Apply 1 application. topically daily.  Dispense: 45 g; Refill: 0 ? ?8. Nail discoloration ?Yellowish discoloration to toenail beds, likely fungal. ?- Terbinafine HCl (LAMISIL AT SPRAY) 1 % SOLN; Apply 2 sprays topically 2 (two) times daily.  Dispense: 125 mL; Refill: 1 ?  ? ? ?Patient was given opportunity to ask questions. Patient verbalized understanding of the plan and was able to repeat key elements of the plan. All questions were answered to their satisfaction.  ?[name], FNP  ? ? I, [name], FNP, have reviewed all documentation for this visit. The documentation on 12/01/21 for the exam, diagnosis, procedures, and orders are all accurate and complete. ? ?IF YOU HAVE BEEN REFERRED TO A SPECIALIST, IT MAY TAKE 1-2 WEEKS TO SCHEDULE/PROCESS THE REFERRAL. IF YOU HAVE NOT HEARD FROM US/SPECIALIST IN TWO WEEKS, PLEASE GIVE [place] A CALL AT [phone].  ? ?THE PATIENT IS ENCOURAGED TO PRACTICE SOCIAL DISTANCING DUE TO THE COVID-19 PANDEMIC.   ?

## 2021-12-01 NOTE — Patient Instructions (Signed)

## 2021-12-04 ENCOUNTER — Encounter: Payer: Self-pay | Admitting: Nurse Practitioner

## 2021-12-29 ENCOUNTER — Encounter: Payer: Self-pay | Admitting: Nurse Practitioner

## 2022-01-01 ENCOUNTER — Ambulatory Visit: Payer: BC Managed Care – PPO | Admitting: Nurse Practitioner

## 2022-01-01 ENCOUNTER — Encounter: Payer: Self-pay | Admitting: Nurse Practitioner

## 2022-01-01 VITALS — BP 130/78 | HR 83 | Temp 98.3°F | Ht 71.0 in | Wt 293.0 lb

## 2022-01-01 DIAGNOSIS — Z6841 Body Mass Index (BMI) 40.0 and over, adult: Secondary | ICD-10-CM

## 2022-01-01 DIAGNOSIS — F32A Depression, unspecified: Secondary | ICD-10-CM | POA: Diagnosis not present

## 2022-01-01 MED ORDER — CITALOPRAM HYDROBROMIDE 10 MG PO TABS: 10.0000 mg | ORAL_TABLET | Freq: Every day | ORAL | 2 refills | Status: DC

## 2022-01-01 NOTE — Patient Instructions (Signed)

## 2022-01-01 NOTE — Progress Notes (Signed)
?Clinical biochemist as a Neurosurgeon for [employer], FNP.,have documented all relevant documentation on the behalf of [name], FNP,as directed by  [name], FNP while in the presence of [name], FNP. ? ?This visit occurred during the SARS-CoV-2 public health emergency.  Safety protocols were in place, including screening questions prior to the visit, additional usage of staff PPE, and extensive cleaning of exam room while observing appropriate contact time as indicated for disinfecting solutions. ? ?Subjective:  ?  ? Patient ID: [name] , male    DOB: 1981/01/10 , 41 y.o.   MRN: [id number] ? ? ?Chief Complaint  ?Patient presents with  ? Depression  ? ? ?HPI ? ?Patient presents today for depression. He has never taken any medication for depression.   ? ? ?Depression ?       The patient presents with no depression.  This is a new problem.  The current episode started 1 to 4 weeks ago.   Associated symptoms include no decreased concentration.   Pertinent negatives include no depression.  ? ?Past Medical History:  ?Diagnosis Date  ? Diabetes mellitus without complication (HCC)   ?  ? ?Family History  ?Family history unknown: Yes  ? ? ? ?Current Outpatient Medications:  ?  amLODipine (NORVASC) 10 MG tablet, Take 1 tablet (10 mg total) by mouth daily., Disp: 90 tablet, Rfl: 1 ?  citalopram (CELEXA) 10 MG tablet, Take 1 tablet (10 mg total) by mouth daily., Disp: 30 tablet, Rfl: 2 ?  mometasone (ELOCON) 0.1 % cream, Apply 1 application. topically daily., Disp: 45 g, Rfl: 0 ?  Terbinafine HCl (LAMISIL AT SPRAY) 1 % SOLN, Apply 2 sprays topically 2 (two) times daily., Disp: 125 mL, Rfl: 1  ? ?No Known Allergies  ? ?Review of Systems  ?Constitutional: Negative.   ?Respiratory: Negative.    ?Cardiovascular: Negative.   ?Gastrointestinal: Negative.   ?Neurological: Negative.   ?Psychiatric/Behavioral:  Positive for depression. Negative for decreased concentration.    ? ?Today's Vitals  ? 01/01/22 1515  ?BP:  130/78  ?Pulse: 83  ?Temp: 98.3 ?F (36.8 ?C)  ?TempSrc: Oral  ?Weight: 293 lb (132.9 kg)  ?Height: 5\' 11"  (1.803 m)  ? ?Body mass index is 40.87 kg/m?.  ? ?Objective:  ?Physical Exam ?Vitals reviewed.  ?Constitutional:   ?   General: He is not in acute distress. ?   Appearance: Normal appearance. He is obese.  ?Eyes:  ?   Extraocular Movements: Extraocular movements intact.  ?   Pupils: Pupils are equal, round, and reactive to light.  ?Cardiovascular:  ?   Rate and Rhythm: Normal rate and regular rhythm.  ?   Pulses: Normal pulses.  ?   Heart sounds: Normal heart sounds. No murmur heard. ?Pulmonary:  ?   Effort: Pulmonary effort is normal. No respiratory distress.  ?   Breath sounds: Normal breath sounds. No wheezing.  ?Skin: ?   General: Skin is warm and dry.  ?   Capillary Refill: Capillary refill takes less than 2 seconds.  ?Neurological:  ?   General: No focal deficit present.  ?   Mental Status: He is alert and oriented to person, place, and time.  ?   Cranial Nerves: No cranial nerve deficit.  ?Psychiatric:     ?   Mood and Affect: Mood normal.     ?   Behavior: Behavior normal.     ?   Thought Content: Thought content normal.     ?  Judgment: Judgment normal.  ?  ? ?   ?Assessment And Plan:  ?   ?1. Depression, unspecified depression type ?Comments: Depression screen score was 14, will start on celexa and refer to psychiatry. Denies suicidal or homicidal ideations ?- Ambulatory referral to Psychology ?- citalopram (CELEXA) 10 MG tablet; Take 1 tablet (10 mg total) by mouth daily.  Dispense: 30 tablet; Refill: 2 ? ?2. Class 3 severe obesity with serious comorbidity and body mass index (BMI) of 40.0 to 44.9 in adult, unspecified obesity type ([place]) ?Chronic ?Discussed healthy diet and regular exercise options  ?Encouraged to exercise at least 150 minutes per week with 2 days of strength training ? ? ? ?Patient was given opportunity to ask questions. Patient verbalized understanding of the plan and was able to  repeat key elements of the plan. All questions were answered to their satisfaction.  ?Minette Brine, FNP  ? ?I, Minette Brine, FNP, have reviewed all documentation for this visit. The documentation on 01/01/22 for the exam, diagnosis, procedures, and orders are all accurate and complete.  ? ?IF YOU HAVE BEEN REFERRED TO A SPECIALIST, IT MAY TAKE 1-2 WEEKS TO SCHEDULE/PROCESS THE REFERRAL. IF YOU HAVE NOT HEARD FROM US/SPECIALIST IN TWO WEEKS, PLEASE GIVE [place] A CALL AT [phone] X [phone].  ? ?THE PATIENT IS ENCOURAGED TO PRACTICE SOCIAL DISTANCING DUE TO THE COVID-19 PANDEMIC.   ?

## 2022-01-02 ENCOUNTER — Encounter: Payer: Self-pay | Admitting: Nurse Practitioner

## 2022-01-24 ENCOUNTER — Other Ambulatory Visit: Payer: Self-pay | Admitting: Nurse Practitioner

## 2022-01-24 DIAGNOSIS — F32A Depression, unspecified: Secondary | ICD-10-CM

## 2022-02-12 ENCOUNTER — Ambulatory Visit: Payer: BC Managed Care – PPO | Admitting: Nurse Practitioner

## 2022-04-24 ENCOUNTER — Other Ambulatory Visit: Payer: Self-pay | Admitting: Nurse Practitioner

## 2022-04-24 DIAGNOSIS — I10 Essential (primary) hypertension: Secondary | ICD-10-CM

## 2022-05-26 ENCOUNTER — Encounter: Payer: BC Managed Care – PPO | Admitting: Nurse Practitioner

## 2022-05-26 NOTE — Progress Notes (Signed)
Not seen

## 2022-09-23 ENCOUNTER — Encounter: Payer: BC Managed Care – PPO | Admitting: Nurse Practitioner

## 2024-02-17 ENCOUNTER — Other Ambulatory Visit: Payer: Self-pay | Admitting: Family Medicine

## 2024-02-17 ENCOUNTER — Ambulatory Visit
Admission: RE | Admit: 2024-02-17 | Discharge: 2024-02-17 | Disposition: A | Discharge status: Home / Self Care | Payer: Worker's Compensation | Source: Ambulatory Visit | Attending: Family Medicine | Admitting: Family Medicine

## 2024-02-17 DIAGNOSIS — M25522 Pain in left elbow: Secondary | ICD-10-CM

## 2024-03-16 ENCOUNTER — Encounter (HOSPITAL_COMMUNITY): Payer: Self-pay | Admitting: *Deleted

## 2024-03-16 ENCOUNTER — Ambulatory Visit (HOSPITAL_COMMUNITY)
Admission: EM | Admit: 2024-03-16 | Discharge: 2024-03-16 | Disposition: A | Discharge status: Home / Self Care | Attending: Emergency Medicine | Admitting: Emergency Medicine

## 2024-03-16 DIAGNOSIS — M25551 Pain in right hip: Secondary | ICD-10-CM | POA: Diagnosis not present

## 2024-03-16 DIAGNOSIS — R202 Paresthesia of skin: Secondary | ICD-10-CM

## 2024-03-16 MED ORDER — NAPROXEN 375 MG PO TABS: 375.0000 mg | ORAL_TABLET | Freq: Two times a day (BID) | ORAL | 0 refills | Status: DC

## 2024-03-16 NOTE — ED Triage Notes (Signed)
 Pt states his right leg is numb since Saturday. He denies any injury and states he is having no issue walking. States that water temp feels differently on the right side.

## 2024-03-16 NOTE — ED Provider Notes (Signed)
 [facility]    CSN: [id number] Arrival date & time: 03/16/24  1038      History   Chief Complaint Chief Complaint  Patient presents with   Numbness    HPI [name] is a 43 y.o. male.   Patient presents with concerns for numbness to his right leg that began on 7/5.  Patient denies any known injury, falls, and denies difficulty walking due to this.  Patient states that when he takes a shower the water feels like a different temperature from his left leg.  Denies weakness or pain to his leg.  Patient states that he has also had some right hip pain that has been bothering him.  Patient states that the pain in his hip is not severe, but it does come and go.  Patient does report history of some chronic back pain as well, however states that he does not have any significant back pain at this time.  Patient's blood pressure is elevated in clinic today.  Patient states that he is prescribed amlodipine .  Patient states that he did some taking this for a while, but then restarted it about 2 months ago.  Patient states that he has been taking this daily for the last 2 months.  Patient denies numbness to any other extremities, headache, chest pain, confusion, speech, and facial droop.  The history is provided by the patient and medical records.    Past Medical History:  Diagnosis Date   Diabetes mellitus without complication [facility])     Patient Active Problem List   Diagnosis Date Noted   Essential hypertension 10/17/2019   Fatigue 10/17/2019   Chapped lips 10/17/2019    History reviewed. No pertinent surgical history.     Home Medications    Prior to Admission medications   Medication Sig Start Date End Date Taking? Authorizing Provider  amLODipine  (NORVASC ) 10 MG tablet TAKE 1 TABLET BY MOUTH EVERY DAY 04/24/22  Yes Georgina Speaks, FNP  naproxen  (NAPROSYN ) 375 MG tablet Take 1 tablet (375 mg total) by mouth 2 (two) times daily. 03/16/24  Yes Johnie Flaming A, NP   citalopram  (CELEXA ) 10 MG tablet TAKE 1 TABLET BY MOUTH EVERY DAY 01/24/22   Georgina Speaks, FNP  mometasone  (ELOCON ) 0.1 % cream Apply 1 application. topically daily. 12/01/21   Moore, Janece, FNP  Terbinafine  HCl (LAMISIL  AT SPRAY) 1 % SOLN Apply 2 sprays topically 2 (two) times daily. 12/01/21   Georgina Speaks, FNP    Family History Family History  Family history unknown: Yes    Social History Social History   Tobacco Use   Smoking status: Never   Smokeless tobacco: Never  Vaping Use   Vaping status: Never Used  Substance Use Topics   Alcohol use: Yes   Drug use: Yes    Types: Marijuana     Allergies   Patient has no known allergies.   Review of Systems Review of Systems  Per HPI  Physical Exam Triage Vital Signs ED Triage Vitals  Encounter Vitals Group     BP 03/16/24 1113 (!) 151/89     Girls Systolic BP Percentile --      Girls Diastolic BP Percentile --      Boys Systolic BP Percentile --      Boys Diastolic BP Percentile --      Pulse Rate 03/16/24 1113 75     Resp 03/16/24 1113 20     Temp 03/16/24 1113 98.6 F (37 C)  Temp Source 03/16/24 1113 Oral     SpO2 03/16/24 1113 98 %     Weight --      Height --      Head Circumference --      Peak Flow --      Pain Score 03/16/24 1112 0     Pain Loc --      Pain Education --      Exclude from Growth Chart --    No data found.  Updated Vital Signs BP (!) 151/89 (BP Location: Right Arm)   Pulse 75   Temp 98.6 F (37 C) (Oral)   Resp 20   SpO2 98%   Visual Acuity Right Eye Distance:   Left Eye Distance:   Bilateral Distance:    Right Eye Near:   Left Eye Near:    Bilateral Near:     Physical Exam Vitals and nursing note reviewed.  Constitutional:      General: He is awake. He is not in acute distress.    Appearance: Normal appearance. He is well-developed and well-groomed. He is not ill-appearing.  Cardiovascular:     Pulses:          Popliteal pulses are 2+ on the right side.        Dorsalis pedis pulses are 2+ on the right side.       Posterior tibial pulses are 2+ on the right side.  Musculoskeletal:     Right hip: Tenderness present. No deformity or bony tenderness. Normal range of motion.     Right upper leg: Normal.     Right knee: Normal.     Right lower leg: Normal.  Skin:    General: Skin is warm and dry.  Neurological:     Mental Status: He is alert and oriented to person, place, and time. Mental status is at baseline.     GCS: GCS eye subscore is 4. GCS verbal subscore is 5. GCS motor subscore is 6.     Cranial Nerves: Cranial nerves 2-12 are intact.     Sensory: Sensory deficit present.     Motor: Motor function is intact.     Coordination: Coordination is intact.     Gait: Gait is intact.     Comments: Mildly decreased sensation noted to right leg upon palpation  Psychiatric:        Behavior: Behavior is cooperative.      UC Treatments / Results  Labs (all labs ordered are listed, but only abnormal results are displayed) Labs Reviewed - No data to display  EKG   Radiology No results found.  Procedures Procedures (including critical care time)  Medications Ordered in UC Medications - No data to display  Initial Impression / Assessment and Plan / UC Course  I have reviewed the triage vital signs and the nursing notes.  Pertinent labs & imaging results that were available during my care of the patient were reviewed by me and considered in my medical decision making (see chart for details).     Patient is overall well-appearing.  Vitals are stable.  Blood pressure is elevated at 151/89.  Upon assessment mildly decreased sensation noted to the right leg upon palpation.  There is also tenderness noted over the right hip.  Pulses present.  No other neurodeficits noted on exam.  Naproxen  as needed for hip pain.  Discussed that paresthesias could be related to hip pain, but recommended following up with primary care provider for further  evaluation.  Given orthopedic follow-up related to hip pain if needed.  Discussed follow-up, return, and strict ER precautions. Final Clinical Impressions(s) / UC Diagnoses   Final diagnoses:  Right hip pain  Paresthesia     Discharge Instructions      As discussed the numbness in your leg could be related to your hip pain. Take naproxen  twice daily as needed for hip pain. You can take 650 mg of Tylenol  every 6-8 hours as needed for breakthrough pain. Alternate between ice and heat. Rest and elevate your leg often. You can follow-up with [facility] or your primary care provider for further evaluation and management of this pain. Be sure you are taking your blood pressure medication daily as prescribed. Return here as needed.  If you develop weakness or inability to walk worsening or spreading numbness, confusion, slurred speech, or facial drooping please seek immediate medical treatment in the emergency department.   ED Prescriptions     Medication Sig Dispense Auth. Provider   naproxen  (NAPROSYN ) 375 MG tablet Take 1 tablet (375 mg total) by mouth 2 (two) times daily. 20 tablet Johnie Flaming A, NP      PDMP not reviewed this encounter.   Johnie Flaming A, NP 03/16/24 1150

## 2024-03-16 NOTE — Discharge Instructions (Addendum)
 As discussed the numbness in your leg could be related to your hip pain. Take naproxen  twice daily as needed for hip pain. You can take 650 mg of Tylenol  every 6-8 hours as needed for breakthrough pain. Alternate between ice and heat. Rest and elevate your leg often. You can follow-up with [facility] or your primary care provider for further evaluation and management of this pain. Be sure you are taking your blood pressure medication daily as prescribed. Return here as needed.  If you develop weakness or inability to walk worsening or spreading numbness, confusion, slurred speech, or facial drooping please seek immediate medical treatment in the emergency department.

## 2024-03-27 ENCOUNTER — Ambulatory Visit: Payer: Self-pay

## 2024-03-27 ENCOUNTER — Emergency Department (HOSPITAL_COMMUNITY)

## 2024-03-27 ENCOUNTER — Other Ambulatory Visit: Payer: Self-pay

## 2024-03-27 ENCOUNTER — Emergency Department (HOSPITAL_COMMUNITY)
Admission: EM | Admit: 2024-03-27 | Discharge: 2024-03-27 | Disposition: A | Discharge status: Home / Self Care | Attending: Emergency Medicine | Admitting: Emergency Medicine

## 2024-03-27 ENCOUNTER — Encounter (HOSPITAL_COMMUNITY): Payer: Self-pay

## 2024-03-27 DIAGNOSIS — Z79899 Other long term (current) drug therapy: Secondary | ICD-10-CM | POA: Insufficient documentation

## 2024-03-27 DIAGNOSIS — E119 Type 2 diabetes mellitus without complications: Secondary | ICD-10-CM | POA: Insufficient documentation

## 2024-03-27 DIAGNOSIS — I10 Essential (primary) hypertension: Secondary | ICD-10-CM | POA: Diagnosis not present

## 2024-03-27 DIAGNOSIS — M25522 Pain in left elbow: Secondary | ICD-10-CM | POA: Insufficient documentation

## 2024-03-27 DIAGNOSIS — R208 Other disturbances of skin sensation: Secondary | ICD-10-CM | POA: Insufficient documentation

## 2024-03-27 HISTORY — DX: Essential (primary) hypertension: I10

## 2024-03-27 LAB — COMPREHENSIVE METABOLIC PANEL WITH GFR
ALT: 30 U/L (ref 0–44)
AST: 28 U/L (ref 15–41)
Albumin: 4.5 g/dL (ref 3.5–5.0)
Alkaline Phosphatase: 50 U/L (ref 38–126)
Anion gap: 10 (ref 5–15)
BUN: 19 mg/dL (ref 6–20)
CO2: 24 mmol/L (ref 22–32)
Calcium: 10.3 mg/dL (ref 8.9–10.3)
Chloride: 106 mmol/L (ref 98–111)
Creatinine, Ser: 1.02 mg/dL (ref 0.61–1.24)
GFR, Estimated: >60 mL/min (ref >60)
Glucose, Bld: 158 mg/dL — ABNORMAL HIGH (ref 70–99)
Potassium: 4.1 mmol/L (ref 3.5–5.1)
Sodium: 140 mmol/L (ref 135–145)
Total Bilirubin: 0.8 mg/dL (ref 0.0–1.2)
Total Protein: 7.4 g/dL (ref 6.5–8.1)

## 2024-03-27 LAB — CBC WITH DIFFERENTIAL/PLATELET
Abs Immature Granulocytes: 0.03 K/uL (ref 0.00–0.07)
Basophils Absolute: 0.1 K/uL (ref 0.0–0.1)
Basophils Relative: 1 %
Eosinophils Absolute: 0.2 K/uL (ref 0.0–0.5)
Eosinophils Relative: 4 %
HCT: 46.6 % (ref 39.0–52.0)
Hemoglobin: 16.2 g/dL (ref 13.0–17.0)
Immature Granulocytes: 1 %
Lymphocytes Relative: 30 %
Lymphs Abs: 1.5 K/uL (ref 0.7–4.0)
MCH: 29.8 pg (ref 26.0–34.0)
MCHC: 34.8 g/dL (ref 30.0–36.0)
MCV: 85.7 fL (ref 80.0–100.0)
Monocytes Absolute: 0.4 K/uL (ref 0.1–1.0)
Monocytes Relative: 9 %
Neutro Abs: 2.8 K/uL (ref 1.7–7.7)
Neutrophils Relative %: 55 %
Platelets: 231 K/uL (ref 150–400)
RBC: 5.44 MIL/uL (ref 4.22–5.81)
RDW: 12.2 % (ref 11.5–15.5)
WBC: 4.9 K/uL (ref 4.0–10.5)
nRBC: 0 % (ref 0.0–0.2)

## 2024-03-27 LAB — URINALYSIS, ROUTINE W REFLEX MICROSCOPIC
Bilirubin Urine: NEGATIVE
Glucose, UA: 50 mg/dL — AB
Hgb urine dipstick: NEGATIVE
Ketones, ur: NEGATIVE mg/dL
Leukocytes,Ua: NEGATIVE
Nitrite: NEGATIVE
Protein, ur: NEGATIVE mg/dL
Specific Gravity, Urine: 1.026 (ref 1.005–1.030)
pH: 5 (ref 5.0–8.0)

## 2024-03-27 LAB — TROPONIN I (HIGH SENSITIVITY)
Troponin I (High Sensitivity): 11 ng/L (ref <18)
Troponin I (High Sensitivity): 13 ng/L (ref <18)

## 2024-03-27 LAB — CBG MONITORING, ED: Glucose-Capillary: 141 mg/dL — ABNORMAL HIGH (ref 70–99)

## 2024-03-27 MED ORDER — LORAZEPAM 1 MG PO TABS: 1.0000 mg | ORAL_TABLET | ORAL | Status: DC | PRN

## 2024-03-27 NOTE — ED Triage Notes (Signed)
 Pt c/o pain to R leg and hip x 2 weeks; denies injury, says pain not improving; ambulatory on arrival to ED

## 2024-03-27 NOTE — Telephone Encounter (Signed)
 FYI Only or Action Required?: FYI only for provider.  Patient was last seen in primary care on 01/01/2022 by Georgina Speaks, FNP.  Called Nurse Triage reporting Neurologic Problem - right side of body is numb. Pt cannot get up  Symptoms began a week ago.  Interventions attempted: Other: went to UC.  Symptoms are: gradually worsening.  Triage Disposition: Call EMS 911 Now  Patient/caregiver understands and will follow disposition?: Yes             Copied from CRM [id number]. Topic: Clinical - Red Word Triage >> Mar 27, 2024 11:12 AM Donna BRAVO wrote: Red Word that prompted transfer to Nurse Triage: patient wife Norberta calling, patient was in urgent care on 03/16/24,  patient fell, whole right side is numb, and can't get up. Reason for Disposition  [1] Weakness (i.e., paralysis, loss of muscle strength) of the face, arm / hand, or leg / foot on one side of the body AND [2] sudden onset AND [3] present now  (Exception: Bell's palsy suspected: weakness on one side of the face developing over hours to days, with no other symptoms.)  Answer Assessment - Initial Assessment Questions 1. SYMPTOM: What is the main symptom you are concerned about? (e.g., weakness, numbness)     Right side weakness- numbness 2. ONSET: When did this start? (e.g., minutes, hours, days; while sleeping)     7/10 3. LAST NORMAL: When was the last time you (the patient) were normal (no symptoms)?     Before 7/10 4. PATTERN Does this come and go, or has it been constant since it started?  Is it present now?     constant  6. NEUROLOGIC SYMPTOMS: Have you had any of the following symptoms: headache, dizziness, vision loss, double vision, changes in speech, unsteady on your feet?     Half of body is numb  Protocols used: Neurologic Deficit-A-AH

## 2024-03-27 NOTE — Discharge Instructions (Addendum)
 Thank you for letting us  evaluate you today. Your CT and MRI of your head were normal. No electrolyte abnormalities nor low blood counts on your labwork. You XR of your hip and elbow were negative for fracture nor dislocation. I have put in referral for neurology. Please follow up with them regarding symptoms  Return to ED if you experience slurred speech, altered mentation, seizures, weakness on one side of your body

## 2024-03-27 NOTE — ED Provider Notes (Signed)
 [place] EMERGENCY DEPARTMENT AT [facility] Provider Note   CSN: [id number] Arrival date & time: 03/27/24  1304     Patient presents with: No chief complaint on file.   [name] is a 43 y.o. male with past medical history of HTN, DM presents to emergency department for evaluation of constant decreased sensation in RUE and RLE for the past 3 weeks.  Reports that when he is in the shower he can feel scalding water on the left side but just feels warm on the right side of his body.  He has had 3 episodes of his leg giving out over the past 3 weeks when he has right hip pain and feels that his leg gives out.  Today, sought ED evaluation as patient's leg gave out causing him to fall onto his knees and onto ground.  Family reports that following this patient became very diaphoretic.  Denies LOC, head injury.  Family bedside denies aphasia, slurred speech, AMS over the 3 weeks but reports that she has noticed him walking differently.  Denies back pain, neck pain  He also reports that he had his left elbow at work 1 month ago.  Occasionally has intermittent tingling in his left arm following.  Denies weakness, numbness in LUE.  Of note, was evaluated in urgent care on 03/16/2024 for similar complaints and was provided muscle relaxer without improvement   HPI     Prior to Admission medications   Medication Sig Start Date End Date Taking? Authorizing Provider  amLODipine  (NORVASC ) 10 MG tablet TAKE 1 TABLET BY MOUTH EVERY DAY 04/24/22   Georgina Speaks, FNP  citalopram  (CELEXA ) 10 MG tablet TAKE 1 TABLET BY MOUTH EVERY DAY 01/24/22   Georgina Speaks, FNP  mometasone  (ELOCON ) 0.1 % cream Apply 1 application. topically daily. 12/01/21   Georgina Speaks, FNP  naproxen  (NAPROSYN ) 375 MG tablet Take 1 tablet (375 mg total) by mouth 2 (two) times daily. 03/16/24   Johnie Flaming A, NP  Terbinafine  HCl (LAMISIL  AT SPRAY) 1 % SOLN Apply 2 sprays topically 2 (two) times daily. 12/01/21   Georgina Speaks,  FNP    Allergies: Patient has no known allergies.    Review of Systems  Neurological:  Positive for weakness.    Updated Vital Signs BP 137/78 (BP Location: Right Arm)   Pulse 65   Temp 98.4 F (36.9 C) (Oral)   Resp 15   Ht 6' 1 (1.854 m)   Wt 127 kg   SpO2 100%   BMI 36.94 kg/m   Physical Exam Vitals and nursing note reviewed.  Constitutional:      General: He is not in acute distress.    Appearance: Normal appearance. He is not ill-appearing.  HENT:     Head: Normocephalic and atraumatic.  Eyes:     General: Lids are normal. Vision grossly intact. No visual field deficit.    Extraocular Movements: Extraocular movements intact.     Right eye: Normal extraocular motion and no nystagmus.     Left eye: Normal extraocular motion and no nystagmus.     Conjunctiva/sclera: Conjunctivae normal.     Pupils: Pupils are equal, round, and reactive to light.  Cardiovascular:     Rate and Rhythm: Normal rate.     Pulses:          Radial pulses are 2+ on the right side and 2+ on the left side.       Dorsalis pedis pulses are 2+ on the right  side and 2+ on the left side.     Heart sounds: Normal heart sounds.  Pulmonary:     Effort: Pulmonary effort is normal. No respiratory distress.     Breath sounds: Normal breath sounds.  Musculoskeletal:     Cervical back: Normal range of motion and neck supple. No rigidity.     Right lower leg: No edema.     Left lower leg: No edema.     Comments: Pelvis stable. No hip tenderness bilaterally. Well perfused extremities  Skin:    General: Skin is warm.     Capillary Refill: Capillary refill takes less than 2 seconds.     Coloration: Skin is not jaundiced or pale.  Neurological:     General: No focal deficit present.     Mental Status: He is alert and oriented to person, place, and time. Mental status is at baseline.     GCS: GCS eye subscore is 4. GCS verbal subscore is 5. GCS motor subscore is 6.     Cranial Nerves: No cranial nerve  deficit, dysarthria or facial asymmetry.     Sensory: No sensory deficit.     Motor: No weakness, tremor, abnormal muscle tone, seizure activity or pronator drift.     Coordination: Coordination normal. Finger-Nose-Finger Test and Heel to [facility] Test normal.     Gait: Gait abnormal.     Deep Tendon Reflexes: Reflexes normal.     Comments: Sensation 1/2 of RUE and RLE. LUE and LLE 2/2. Sharp sensation intact in all extremities. Motor 5/5 of BUE and BLE. No pronator drift. Grip strength equal. No aphasia nor slurred speech. Walks gingerly 2/2 decreased sensation of right side     (all labs ordered are listed, but only abnormal results are displayed) Labs Reviewed  COMPREHENSIVE METABOLIC PANEL WITH GFR - Abnormal; Notable for the following components:      Result Value   Glucose, Bld 158 (*)    All other components within normal limits  URINALYSIS, ROUTINE W REFLEX MICROSCOPIC - Abnormal; Notable for the following components:   Glucose, UA 50 (*)    All other components within normal limits  CBG MONITORING, ED - Abnormal; Notable for the following components:   Glucose-Capillary 141 (*)    All other components within normal limits  CBC WITH DIFFERENTIAL/PLATELET  TROPONIN I (HIGH SENSITIVITY)  TROPONIN I (HIGH SENSITIVITY)    EKG: None  Radiology: MR BRAIN WO CONTRAST Result Date: 03/27/2024 EXAM: MRI BRAIN WITHOUT CONTRAST 03/27/2024 06:24:09 PM TECHNIQUE: Multiplanar multisequence MRI of the head/brain was performed without the administration of intravenous contrast. COMPARISON: None available. CLINICAL HISTORY: Neuro deficit, acute, stroke suspected. FINDINGS: BRAIN AND VENTRICLES: No acute infarct. No intracranial hemorrhage. No mass. No midline shift. No hydrocephalus. The sella is unremarkable. Normal flow voids. ORBITS: No acute abnormality. SINUSES AND MASTOIDS: No acute abnormality. BONES AND SOFT TISSUES: Normal marrow signal. No acute soft tissue abnormality. IMPRESSION: 1. No  acute intracranial abnormality. Electronically signed by: Franky Stanford MD 03/27/2024 07:37 PM EDT RP Workstation: [id number]   CT Head Wo Contrast Result Date: 03/27/2024 CLINICAL DATA:  Provided history: Neuro deficit, acute, stroke suspected EXAM: CT HEAD WITHOUT CONTRAST TECHNIQUE: Contiguous axial images were obtained from the base of the skull through the vertex without intravenous contrast. RADIATION DOSE REDUCTION: This exam was performed according to the departmental dose-optimization program which includes automated exposure control, adjustment of the mA and/or kV according to patient size and/or use of iterative reconstruction technique. COMPARISON:  None  available. FINDINGS: Brain: No intracranial hemorrhage, mass effect, or midline shift. No hydrocephalus. The basilar cisterns are patent. No evidence of territorial infarct or acute ischemia. No extra-axial or intracranial fluid collection. Vascular: No hyperdense vessel or unexpected calcification. Skull: No fracture or focal lesion. Sinuses/Orbits: Minor mucosal thickening of ethmoid air cells. Other: Probable scarring in the right parietal scalp. IMPRESSION: No acute intracranial abnormality. Electronically Signed   By: Andrea Gasman M.D.   On: 03/27/2024 16:08   DG Elbow Complete Left Result Date: 03/27/2024 CLINICAL DATA:  Left elbow pain. EXAM: LEFT ELBOW - COMPLETE 3+ VIEW COMPARISON:  Left elbow radiograph dated 02/17/2024. FINDINGS: No acute fracture or dislocation. Tiny bone fragment along the distal humeral articular surface similar to prior radiograph may represent a small intra-articular loose body or related to a prior cortical avulsion fracture. If symptoms continue MRI may provide better evaluation. The bones are well mineralized. No arthritic changes. No joint effusion. The soft tissues are unremarkable. IMPRESSION: 1. No acute fracture or dislocation. 2. Tiny bone fragment along the distal humeral articular surface may represent  a small intra-articular loose body or related to a prior cortical avulsion fracture. Electronically Signed   By: Vanetta Chou M.D.   On: 03/27/2024 14:02   DG HIP UNILAT WITH PELVIS 2-3 VIEWS RIGHT Result Date: 03/27/2024 CLINICAL DATA:  Right hip pain for 2 weeks. EXAM: DG HIP (WITH OR WITHOUT PELVIS) 2-3V RIGHT COMPARISON:  None Available. FINDINGS: The mineralization and alignment are normal. There is no evidence of acute fracture or dislocation. No evidence of femoral head osteonecrosis. The hip and sacroiliac joint spaces appear preserved. Disc space narrowing noted at L4-5. The soft tissues appear unremarkable. IMPRESSION: No acute osseous findings or significant arthropathic changes. Lower lumbar spondylosis. Electronically Signed   By: Elsie Perone M.D.   On: 03/27/2024 14:02     Medications Ordered in the ED  LORazepam  (ATIVAN ) tablet 1 mg (has no administration in time range)                                   Medical Decision Making Amount and/or Complexity of Data Reviewed Labs: ordered. Radiology: ordered.  Risk Prescription drug management.   Patient presents to the ED for concern of decreased sensation of right side of body, this involves an extensive number of treatment options, and is a complaint that carries with it a high risk of complications and morbidity.  The differential diagnosis includes CVA/TIA, cervical radiculopathy, neuropathy, fracture, dislocation   Co morbidities that complicate the patient evaluation  See HPI   Additional history obtained:  Additional history obtained from Nursing   External records from outside source obtained and reviewed including triage RN note   Lab Tests:  I Ordered, and personally interpreted labs.  The pertinent results include:   Troponin negative x 2 CBG 141 UA without infection or hematuria   Imaging Studies ordered:  I ordered imaging studies including CT head, MR brain, left elbow XR, right hip XR  I  independently visualized and interpreted imaging which showed no acute abnormalities I agree with the radiologist interpretation   Cardiac Monitoring:  The patient was maintained on a cardiac monitor.  I personally viewed and interpreted the cardiac monitored which showed an underlying rhythm of: Sinus bradycardia with Q waves in lead III, inverted T waves in III and AVF. Previous EKG in 2022 did not have Q waves nor inverted  T waves    Consultations Obtained:  I requested consultation with the neurology Dr. Voncile,  and discussed lab and imaging findings as well as pertinent plan - they recommend:  MR neck to r/o cervical lesion as etiology of symptoms If negative can f/u with neuro outpatient   Problem List / ED Course:  Decreased sensation  Sharp sensation intact Decreased sensation of RUE and RLE.  CT and MRI negative for stroke Offered MR cervical neck, however patient did not want to stay for additional scan. Provided pt with neurology referral and should f/u with neuro in one week Considered dissection however has absolutely no complaints of CP nor back pain. Symptoms have been persistent for past three weeks and would likely be more symptomatic. No pulse deficits. Trops WNL Right hip pain No current pain XR negative for fracture, dislocation Able to WB equally. Walks wo assistance No gross swelling, warmth, erythema. Low suspicion for gout nor septic joint DP 2+ equally with well perfused ext Left elbow pain Fall at work 2 weeks ago XR negative for fracture, dislocation No gross swelling, warmth, erythema. Low suspicion for gout nor septic joint Radial 2+ equally with well perfused ext Abnormal EKG Trops negative x2. Low suspicion for ACS. No complaints of CP nor [facility] Recommended cardiology f/u   Reevaluation:  After the interventions noted above, I reevaluated the patient and found that they have :stayed the same   Social Determinants of  Health:  Nonsmoker Has PCP   Dispostion:  After consideration of the diagnostic results and the patients response to treatment, I feel that the patent would benefit from outpatient management with neuro follow-up.   Discussed ED workup, disposition, return to ED precautions with patient who expresses understanding agrees with plan.  All questions answered to their satisfaction.  They are agreeable to plan.  Discharge instructions provided on paperwork  Final diagnoses:  Decreased sensation  Left elbow pain    ED Discharge Orders          Ordered    Ambulatory referral to Neurology       Comments: An appointment is requested in approximately: 1 week   03/27/24 2054               Minnie Tinnie BRAVO, PA 03/27/24 2237    Pamella Ozell LABOR, DO 03/28/24 1554

## 2024-03-27 NOTE — ED Triage Notes (Addendum)
 Pt states he has numbness in right leg x 2 weeks. Pt states he has intermittent pain in right hip when he turns sharply. Pt states he also hit his left elbow and now has intermittent tingling in left arm x 4 weeks. Pt states he fell today d/t pain. Pt denies bowel/bladder incontinence.

## 2024-03-28 ENCOUNTER — Ambulatory Visit: Payer: Self-pay

## 2024-03-28 NOTE — Telephone Encounter (Signed)
 FYI Only or Action Required?: Action required by provider: request for appointment. Additional testing  Patient was last seen in primary care on 01/01/2022 by Georgina Speaks, FNP.  Called Nurse Triage reporting Extremity Weakness.  Symptoms began a week ago.  Interventions attempted: Other: UC, ED.  Symptoms are: gradually worsening.  Triage Disposition: Call EMS 911 Now - refused ED  Patient/caregiver understands and will follow disposition?: No, refuses disposition - spoke with Misty on CAL line. No appts. She requested that note be sent to clinical for review and recommendations.  Pt has been seen at both UC and ED for this. ED and UC were unable to determine cause and pt was discharged. PT would like to be seen asap at PCP for additional care. Please contact wife, Crew Goren at [phone]                   Copied from CRM [id number]. Topic: Clinical - Red Word Triage >> Mar 28, 2024  9:10 AM Fonda T wrote: [facility] that prompted transfer to Nurse Triage: Received call from spouse, [place], [place] verified, states patient has been having episodes of falling, numbness on left side with no sensation, with fatigue and weakness. Also once he falls, he has difficulty getting up. Recently seen in [facility] ER, but symptoms are worsening. Reason for Disposition  [1] Weakness (i.e., paralysis, loss of muscle strength) of the face, arm / hand, or leg / foot on one side of the body AND [2] sudden onset AND [3] present now  (Exception: Bell's palsy suspected: weakness on one side of the face developing over hours to days, with no other symptoms.)  Answer Assessment - Initial Assessment Questions 1. SYMPTOM: What is the main symptom you are concerned about? (e.g., weakness, numbness)     Right side weakness, falls 2. ONSET: When did this start? (e.g., minutes, hours, days; while sleeping)     1 week 3. LAST NORMAL: When was the last time you (the patient) were normal (no  symptoms)?     1 week 4. PATTERN Does this come and go, or has it been constant since it started?  Is it present now?     constant  Protocols used: Neurologic Deficit-A-AH

## 2024-03-29 ENCOUNTER — Telehealth: Payer: Self-pay

## 2024-03-29 NOTE — Transitions of Care (Post Inpatient/ED Visit) (Signed)
   03/29/2024  Name: [name] MRN: [id number] DOB: 13-Aug-1981  Today's TOC FU Call Status: Today's TOC FU Call Status:: Unsuccessful Call (1st Attempt) Unsuccessful Call (1st Attempt) Date: 03/29/24  Attempted to reach the patient regarding the most recent Inpatient/ED visit.  Follow Up Plan: Additional outreach attempts will be made to reach the patient to complete the Transitions of Care (Post Inpatient/ED visit) call.   Signature Kristeen Lunger, CMA

## 2024-04-13 ENCOUNTER — Encounter: Payer: Self-pay | Admitting: Family Medicine

## 2024-04-13 ENCOUNTER — Ambulatory Visit: Payer: Self-pay | Admitting: Family Medicine

## 2024-04-13 VITALS — BP 124/90 | HR 72 | Temp 98.4°F | Ht 73.0 in | Wt 313.0 lb

## 2024-04-13 DIAGNOSIS — R7309 Other abnormal glucose: Secondary | ICD-10-CM | POA: Insufficient documentation

## 2024-04-13 DIAGNOSIS — E66813 Obesity, class 3: Secondary | ICD-10-CM

## 2024-04-13 DIAGNOSIS — I10 Essential (primary) hypertension: Secondary | ICD-10-CM

## 2024-04-13 DIAGNOSIS — M25522 Pain in left elbow: Secondary | ICD-10-CM

## 2024-04-13 DIAGNOSIS — E1169 Type 2 diabetes mellitus with other specified complication: Secondary | ICD-10-CM | POA: Diagnosis not present

## 2024-04-13 DIAGNOSIS — E1165 Type 2 diabetes mellitus with hyperglycemia: Secondary | ICD-10-CM | POA: Diagnosis not present

## 2024-04-13 DIAGNOSIS — M5416 Radiculopathy, lumbar region: Secondary | ICD-10-CM

## 2024-04-13 DIAGNOSIS — Z6841 Body Mass Index (BMI) 40.0 and over, adult: Secondary | ICD-10-CM

## 2024-04-13 DIAGNOSIS — R200 Anesthesia of skin: Secondary | ICD-10-CM

## 2024-04-13 DIAGNOSIS — E785 Hyperlipidemia, unspecified: Secondary | ICD-10-CM | POA: Diagnosis not present

## 2024-04-13 DIAGNOSIS — R202 Paresthesia of skin: Secondary | ICD-10-CM

## 2024-04-13 DIAGNOSIS — E782 Mixed hyperlipidemia: Secondary | ICD-10-CM | POA: Insufficient documentation

## 2024-04-13 MED ORDER — GABAPENTIN 100 MG PO CAPS: 100.0000 mg | ORAL_CAPSULE | Freq: Two times a day (BID) | ORAL | 1 refills | Status: DC | PRN

## 2024-04-13 MED ORDER — AMLODIPINE BESYLATE 10 MG PO TABS: 10.0000 mg | ORAL_TABLET | Freq: Every day | ORAL | 1 refills | Status: DC

## 2024-04-13 NOTE — Progress Notes (Signed)
 I,Jameka J Llittleton, CMA,acting as a neurosurgeon for [employer], NP.,have documented all relevant documentation on the behalf of Bruna Creighton, NP,as directed by  Bruna Creighton, NP while in the presence of Bruna Creighton, NP.  Subjective:  Patient ID: [name] , male    DOB: Feb 07, 1981 , 43 y.o.   MRN: [id number]  Chief Complaint  Patient presents with   ER F/U    Patient presents today for a ER f/u. Patient reports he went to the ER on 7/21 for decreased sensation in his right leg. He reports his leg is a little bit better he is able to walk and he is having neck pain but it is only when he turns his head a certain way.     HPI Discussed the use of AI scribe software for clinical note transcription with the patient, who gave verbal consent to proceed.  History of Present Illness      [name] is a 43 year old male with diabetes who presents with numbness in his right leg and back pain.  He has been experiencing numbness in his right leg extending up to his waist and hip for approximately three weeks. The numbness began prior to a fall; about a week after the onset of numbness, he experienced a fall, which he attributes to a sharp pain in his hip and lack of sensation in his leg. He describes decreased sensation, noting that hot water feels less hot on the affected side, and he does not feel hot or cold temperatures. The numbness has impacted his ability to work, as he is unable to lift, squat, or lift boxes. His leg shakes and wobbles when he stands up.  He has a history of diabetes diagnosed at least twelve years ago, with a previous A1c of 14, which he managed to lower to around 6. He was taken off medication after lowering his A1c but has not had his A1c checked recently. He is concerned about his current A1c levels.  He also reports a history of back pain, which he attributes to wear and tear from previous jobs that required heavy lifting without assistance. He denies any history of  significant back injury but mentions occasional back pain upon waking. The fall he experienced occurred about a week after the onset of numbness, during which he fell on his buttocks and lightly hit a doorknob.  He is currently taking amlodipine  for hypertension but has not been taking it regularly over the past year and a half. He ran out of his medication about a week ago. He also mentions a past prescription for Celexa  for anxiety and depression, which he did not take consistently.  He reports a previous low vitamin B12 level for which he was prescribed a vitamin supplement several years ago. He is unsure if this is related to his current symptoms. No incontinence or stroke-like symptoms. He also mentions numbness in his left elbow and three fingers, which began after an injury at work.      Past Medical History:  Diagnosis Date   Diabetes mellitus without complication (HCC)    Hypertension      Family History  Family history unknown: Yes     Current Outpatient Medications:    gabapentin  (NEURONTIN ) 100 MG capsule, Take 1 capsule (100 mg total) by mouth 2 (two) times daily as needed., Disp: 60 capsule, Rfl: 1   amLODipine  (NORVASC ) 10 MG tablet, Take 1 tablet (10 mg total) by mouth daily., Disp: 90 tablet,  Rfl: 1   citalopram  (CELEXA ) 10 MG tablet, TAKE 1 TABLET BY MOUTH EVERY DAY (Patient not taking: Reported on 04/13/2024), Disp: 90 tablet, Rfl: 1   mometasone  (ELOCON ) 0.1 % cream, Apply 1 application. topically daily. (Patient not taking: Reported on 04/13/2024), Disp: 45 g, Rfl: 0   naproxen  (NAPROSYN ) 375 MG tablet, Take 1 tablet (375 mg total) by mouth 2 (two) times daily. (Patient not taking: Reported on 04/13/2024), Disp: 20 tablet, Rfl: 0   Terbinafine  HCl (LAMISIL  AT SPRAY) 1 % SOLN, Apply 2 sprays topically 2 (two) times daily. (Patient not taking: Reported on 04/13/2024), Disp: 125 mL, Rfl: 1   No Known Allergies   Review of Systems  Constitutional: Negative.   HENT: Negative.     Respiratory: Negative.    Cardiovascular: Negative.   Gastrointestinal: Negative.   Musculoskeletal:  Positive for back pain and gait problem.  Skin: Negative.   Psychiatric/Behavioral: Negative.       Today's Vitals   04/13/24 1044 04/13/24 1122  BP: (!) 124/90 (!) 124/90  Pulse: 72   Temp: 98.4 F (36.9 C)   TempSrc: Oral   Weight: (!) 313 lb (142 kg)   Height: 6' 1 (1.854 m)   PainSc: 0-No pain    Body mass index is 41.3 kg/m.  Wt Readings from Last 3 Encounters:  04/13/24 (!) 313 lb (142 kg)  03/27/24 280 lb (127 kg)  01/01/22 293 lb (132.9 kg)    The 10-year ASCVD risk score (Arnett DK, et al., 2019) is: 9.5%   Values used to calculate the score:     Age: 24 years     Clincally relevant sex: Male     Is Non-Hispanic African American: Yes     Diabetic: Yes     Tobacco smoker: No     Systolic Blood Pressure: 124 mmHg     Is BP treated: Yes     HDL Cholesterol: 46 mg/dL     Total Cholesterol: 220 mg/dL  Objective:  Physical Exam HENT:     Head: Normocephalic.  Cardiovascular:     Rate and Rhythm: Normal rate and regular rhythm.  Pulmonary:     Effort: Pulmonary effort is normal.     Breath sounds: Normal breath sounds.  Neurological:     Mental Status: He is alert.         Assessment And Plan:  Type 2 diabetes mellitus with hyperglycemia, without long-term current use of insulin (HCC) Assessment & Plan: Previous A1c of 14 reduced to borderline without medication. Current A1c not checked. Plan to assess glycemic control and determine medication need. - Order A1c test  Orders: -     Hemoglobin A1c  Left elbow pain  Hyperlipidemia associated with type 2 diabetes mellitus (HCC) Assessment & Plan: Slightly elevated cholesterol levels. - Order cholesterol test. -start statin therapy  Orders: -     Lipid panel  Lumbar back pain with radiculopathy affecting right lower extremity Assessment & Plan:  Refer to orthopedic specialist for further  evaluation  Orders: -     Ambulatory referral to Orthopedics  Numbness and tingling of right upper and lower extremity Assessment & Plan: Numbness and decreased sensation in right leg suggest lumbar radiculopathy, possible herniated disc, or peripheral neuropathy. No incontinence. Previous fall noted. MRI of brain and CT of head ruled out stroke. - Refer to orthopedic specialist for further evaluation. - Prescribe gabapentin  for numbness, advise taking at night or when able to rest. - Check vitamin B12  levels. - Consider B12 injection if deficiency is confirmed.   Orders: -     Gabapentin ; Take 1 capsule (100 mg total) by mouth 2 (two) times daily as needed.  Dispense: 60 capsule; Refill: 1 -     Vitamin B12  Essential hypertension Assessment & Plan: Inconsistent medication adherence. Not taking amlodipine . Blood pressure at 124/90 mmHg. Emphasized importance of regular medication adherence. - Refill amlodipine  prescription. - Advise regular blood pressure monitoring at home. - Encourage consistent medication adherence.  Orders: -     amLODIPine  Besylate; Take 1 tablet (10 mg total) by mouth daily.  Dispense: 90 tablet; Refill: 1   Assessment & Plan Low back pain with right leg numbness and sensory loss (lumbar radiculopathy) Numbness and decreased sensation in right leg suggest lumbar radiculopathy, possible herniated disc, or peripheral neuropathy. No incontinence. Previous fall noted. MRI of brain and CT of head ruled out stroke. -. - Prescribe gabapentin  for numbness, advise taking at night or when able to rest. - Order MRI of the back if not previously completed. - Check vitamin B12 levels. - Consider B12 injection if deficiency is confirmed.  Hypertension Inconsistent medication adherence. Not taking amlodipine . Blood pressure at 124/90 mmHg. Emphasized importance of regular medication adherence. - Refill amlodipine  prescription. - Advise regular blood pressure  monitoring at home. - Encourage consistent medication adherence.  Type 2 diabetes mellitus, not currently on medication . - Discuss potential diabetes medication options based on A1c results.  Hyperlipidemia Slightly elevated cholesterol levels. - Order cholesterol test.  History of vitamin B12 deficiency Previous deficiency treated with oral supplementation. Current B12 status unknown. - Order vitamin B12 test. - Consider B12 injection if deficiency is confirmed.   Return in about 14 weeks (around 07/20/2024) for bpc.  Patient was given opportunity to ask questions. Patient verbalized understanding of the plan and was able to repeat key elements of the plan. All questions were answered to their satisfaction.   I, Bruna Creighton, NP, have reviewed all documentation for this visit. The documentation on 04/13/2024 for the exam, diagnosis, procedures, and orders are all accurate and complete.     IF YOU HAVE BEEN REFERRED TO A SPECIALIST, IT MAY TAKE 1-2 WEEKS TO SCHEDULE/PROCESS THE REFERRAL. IF YOU HAVE NOT HEARD FROM US /SPECIALIST IN TWO WEEKS, PLEASE GIVE US  A CALL AT [phone].

## 2024-04-14 ENCOUNTER — Ambulatory Visit: Payer: Self-pay | Admitting: Family Medicine

## 2024-04-14 ENCOUNTER — Encounter: Payer: Self-pay | Admitting: Nurse Practitioner

## 2024-04-14 DIAGNOSIS — Z6841 Body Mass Index (BMI) 40.0 and over, adult: Secondary | ICD-10-CM | POA: Insufficient documentation

## 2024-04-14 DIAGNOSIS — E1169 Type 2 diabetes mellitus with other specified complication: Secondary | ICD-10-CM | POA: Insufficient documentation

## 2024-04-14 DIAGNOSIS — E1165 Type 2 diabetes mellitus with hyperglycemia: Secondary | ICD-10-CM | POA: Insufficient documentation

## 2024-04-14 LAB — VITAMIN B12: Vitamin B-12: 530 pg/mL (ref 200–1100)

## 2024-04-14 LAB — LIPID PANEL
Cholesterol: 220 mg/dL — ABNORMAL HIGH (ref <200)
HDL: 46 mg/dL (ref >=40)
LDL Cholesterol (Calc): 150 mg/dL — ABNORMAL HIGH
Non-HDL Cholesterol (Calc): 174 mg/dL — ABNORMAL HIGH (ref <130)
Total CHOL/HDL Ratio: 4.8 (calc) (ref <5.0)
Triglycerides: 121 mg/dL (ref <150)

## 2024-04-14 LAB — HEMOGLOBIN A1C
Hgb A1c MFr Bld: 7.3 % — ABNORMAL HIGH (ref <5.7)
Mean Plasma Glucose: 163 mg/dL
eAG (mmol/L): 9 mmol/L

## 2024-04-14 MED ORDER — ATORVASTATIN CALCIUM 20 MG PO TABS: 20.0000 mg | ORAL_TABLET | Freq: Every day | ORAL | 1 refills | Status: DC

## 2024-04-14 MED ORDER — METFORMIN HCL ER 750 MG PO TB24: 750.0000 mg | ORAL_TABLET | Freq: Every day | ORAL | 1 refills | Status: DC

## 2024-04-14 NOTE — Assessment & Plan Note (Signed)
 Slightly elevated cholesterol levels. - Order cholesterol test. -start statin therapy

## 2024-04-14 NOTE — Assessment & Plan Note (Addendum)
 Numbness and decreased sensation in right leg suggest lumbar radiculopathy, possible herniated disc, or peripheral neuropathy. No incontinence. Previous fall noted. MRI of brain and CT of head ruled out stroke. - Refer to orthopedic specialist for further evaluation. - Prescribe gabapentin  for numbness, advise taking at night or when able to rest. - Check vitamin B12 levels. - Consider B12 injection if deficiency is confirmed.

## 2024-04-14 NOTE — Progress Notes (Signed)
 Your A1c is 7.3 so it is back to the diabetic range and also your cholesterol levels are elevated. Sending medications for diabetes and the pharmacy to the pharmacy now. Metformin  and Atorvastatin , take each once daily.Also eat a low carb,low fat diet .  Thanks!

## 2024-04-14 NOTE — Assessment & Plan Note (Signed)
 Inconsistent medication adherence. Not taking amlodipine . Blood pressure at 124/90 mmHg. Emphasized importance of regular medication adherence. - Refill amlodipine  prescription. - Advise regular blood pressure monitoring at home. - Encourage consistent medication adherence.

## 2024-04-14 NOTE — Assessment & Plan Note (Signed)
 Previous A1c of 14 reduced to borderline without medication. Current A1c not checked. Plan to assess glycemic control and determine medication need. - Order A1c test

## 2024-04-14 NOTE — Assessment & Plan Note (Signed)
 Refer to orthopedic specialist for further evaluation

## 2024-04-25 ENCOUNTER — Encounter: Payer: Self-pay | Admitting: Family Medicine

## 2024-05-16 ENCOUNTER — Encounter: Payer: Self-pay | Admitting: Family Medicine

## 2024-05-19 ENCOUNTER — Other Ambulatory Visit: Payer: Self-pay | Admitting: Family Medicine

## 2024-07-20 ENCOUNTER — Ambulatory Visit: Payer: Self-pay | Admitting: Nurse Practitioner

## 2024-07-20 ENCOUNTER — Encounter: Payer: Self-pay | Admitting: Nurse Practitioner

## 2024-07-20 ENCOUNTER — Ambulatory Visit: Payer: Self-pay

## 2024-07-20 NOTE — Telephone Encounter (Signed)
 FYI Only or Action Required?: FYI only for provider: appointment scheduled on 07/21/2024 at 11:20 AM.  Patient was last seen in primary care on 04/13/2024 by Petrina Pries, NP.  Called Nurse Triage reporting Fatigue.  Symptoms began several months ago.  Interventions attempted: Rest, hydration, or home remedies.  Symptoms are: unchanged.  Triage Disposition: See PCP Within 2 Weeks, See Physician Within 24 Hours  Patient/caregiver understands and will follow disposition?: Yes  Wife called to request another appointment. Patient works third shift and slept through his appointment today. Reports several months of leg pain with numbness, leg swelling and fatigue. Wife is wanting to get him seen again. Scheduled to see another provider tomorrow in the office at 11:20 AM.   Copied from CRM #[id number]. Topic: Clinical - Red Word Triage >> Jul 20, 2024 11:11 AM Treva T wrote: [facility] that prompted transfer to Nurse Triage: Patient spouse, Quinn states he cannot walk, and this has been a daily occurrence.  Has increased swelling and tingling, increased fatigue, and is concerned regarding his health. States he is complaining everyday day regarding these issues.   Patient ad an appt this morning, but overslept and missed appt, so pt spouse is calling to see if he can be seen today by another physician due to above concerns. Reason for Disposition  [1] MODERATE leg swelling (e.g., swelling extends up to knees) AND [2] new-onset or getting worse  Leg pain or muscle cramp is a chronic symptom (recurrent or ongoing AND present > 4 weeks)  Answer Assessment - Initial Assessment Questions 1. ONSET: When did the pain start?      Several months 2. LOCATION: Where is the pain located?      Right leg 3. PAIN: How bad is the pain?    (Scale 1-10; or mild, moderate, severe)     severe 4. WORK OR EXERCISE: Has there been any recent work or exercise that involved this part of the body?       no 5. CAUSE: What do you think is causing the leg pain?     Unsure what is causing the pain. Patient is reporting severe numbness to his leg.  6. OTHER SYMPTOMS: Do you have any other symptoms? (e.g., chest pain, back pain, breathing difficulty, swelling, rash, fever, numbness, weakness)     Numbness, tingling, fatigue, swelling to legs.  Answer Assessment - Initial Assessment Questions 1. ONSET: When did the swelling start? (e.g., minutes, hours, days)     Several months 2. LOCATION: What part of the leg is swollen?  Are both legs swollen or just one leg?     Bilateral legs from the knee down. Worse on the right 3. SEVERITY: How bad is the swelling? (e.g., localized; mild, moderate, severe)     moderate 4. REDNESS: Is there redness or signs of infection?     no 5. PAIN: Is the swelling painful to touch? If Yes, ask: How painful is it?   (Scale 1-10; mild, moderate or severe)     severe 6. FEVER: Do you have a fever? If Yes, ask: What is it, how was it measured, and when did it start?      no 7. CAUSE: What do you think is causing the leg swelling?     unsure 8. MEDICAL HISTORY: Do you have a history of blood clots (e.g., DVT), cancer, heart failure, kidney disease, or liver failure?     no 9. RECURRENT SYMPTOM: Have you had leg swelling before? If Yes,  ask: When was the last time? What happened that time?     yes 10. OTHER SYMPTOMS: Do you have any other symptoms? (e.g., chest pain, difficulty breathing)       Fatigue, tingling, numbness  Protocols used: Leg Pain-A-AH, Leg Swelling and Edema-A-AH

## 2024-07-20 NOTE — Progress Notes (Deleted)
 LILLETTE Kristeen JINNY Gladis, CMA,acting as a neurosurgeon for [name], FNP.,have documented all relevant documentation on the behalf of [name], FNP,as directed by  [name], FNP while in the presence of [name], FNP.  Subjective:  Patient ID: [name] , male    DOB: October 07, 1980 , 43 y.o.   MRN: [id number]  No chief complaint on file.   HPI  HPI   Past Medical History:  Diagnosis Date   Diabetes mellitus without complication (HCC)    Hypertension      Family History  Family history unknown: Yes     Current Outpatient Medications:    amLODipine  (NORVASC ) 10 MG tablet, Take 1 tablet (10 mg total) by mouth daily., Disp: 90 tablet, Rfl: 1   atorvastatin  (LIPITOR) 20 MG tablet, Take 1 tablet (20 mg total) by mouth daily., Disp: 90 tablet, Rfl: 1   citalopram  (CELEXA ) 10 MG tablet, TAKE 1 TABLET BY MOUTH EVERY DAY (Patient not taking: Reported on 04/13/2024), Disp: 90 tablet, Rfl: 1   gabapentin  (NEURONTIN ) 100 MG capsule, Take 1 capsule (100 mg total) by mouth 2 (two) times daily as needed., Disp: 60 capsule, Rfl: 1   metFORMIN  (GLUCOPHAGE -XR) 750 MG 24 hr tablet, Take 1 tablet (750 mg total) by mouth daily with breakfast., Disp: 90 tablet, Rfl: 1   mometasone  (ELOCON ) 0.1 % cream, Apply 1 application. topically daily. (Patient not taking: Reported on 04/13/2024), Disp: 45 g, Rfl: 0   naproxen  (NAPROSYN ) 375 MG tablet, Take 1 tablet (375 mg total) by mouth 2 (two) times daily. (Patient not taking: Reported on 04/13/2024), Disp: 20 tablet, Rfl: 0   Terbinafine  HCl (LAMISIL  AT SPRAY) 1 % SOLN, Apply 2 sprays topically 2 (two) times daily. (Patient not taking: Reported on 04/13/2024), Disp: 125 mL, Rfl: 1   No Known Allergies   Review of Systems   There were no vitals filed for this visit. There is no height or weight on file to calculate BMI.  Wt Readings from Last 3 Encounters:  04/13/24 (!) 313 lb (142 kg)  03/27/24 280 lb (127 kg)  01/01/22 293 lb (132.9 kg)    The 10-year ASCVD  risk score (Arnett DK, et al., 2019) is: 10.1%   Values used to calculate the score:     Age: 9 years     Clincally relevant sex: Male     Is Non-Hispanic African American: Yes     Diabetic: Yes     Tobacco smoker: No     Systolic Blood Pressure: 124 mmHg     Is BP treated: Yes     HDL Cholesterol: 46 mg/dL     Total Cholesterol: 220 mg/dL  Objective:  Physical Exam      Assessment And Plan:   Assessment & Plan Essential hypertension  Type 2 diabetes mellitus with hyperglycemia, without long-term current use of insulin (HCC)  Hyperlipidemia associated with type 2 diabetes mellitus (HCC)   No orders of the defined types were placed in this encounter.    No follow-ups on file.  Patient was given opportunity to ask questions. Patient verbalized understanding of the plan and was able to repeat key elements of the plan. All questions were answered to their satisfaction.    LILLETTE [name], FNP, have reviewed all documentation for this visit. The documentation on 07/20/24 for the exam, diagnosis, procedures, and orders are all accurate and complete.   IF YOU HAVE BEEN REFERRED TO A SPECIALIST, IT MAY TAKE 1-2 WEEKS TO SCHEDULE/PROCESS THE  REFERRAL. IF YOU HAVE NOT HEARD FROM US /SPECIALIST IN TWO WEEKS, PLEASE GIVE US  A CALL AT [phone].

## 2024-07-21 ENCOUNTER — Encounter: Payer: Self-pay | Admitting: Nurse Practitioner

## 2024-07-21 ENCOUNTER — Encounter: Payer: Self-pay | Admitting: Family Medicine

## 2024-07-21 ENCOUNTER — Ambulatory Visit: Payer: Self-pay | Admitting: Family Medicine

## 2024-07-21 VITALS — BP 130/78 | HR 86 | Temp 98.2°F | Ht 73.0 in | Wt 305.0 lb

## 2024-07-21 DIAGNOSIS — R200 Anesthesia of skin: Secondary | ICD-10-CM

## 2024-07-21 DIAGNOSIS — E1169 Type 2 diabetes mellitus with other specified complication: Secondary | ICD-10-CM

## 2024-07-21 DIAGNOSIS — M5416 Radiculopathy, lumbar region: Secondary | ICD-10-CM | POA: Diagnosis not present

## 2024-07-21 DIAGNOSIS — Z6841 Body Mass Index (BMI) 40.0 and over, adult: Secondary | ICD-10-CM

## 2024-07-21 DIAGNOSIS — E1165 Type 2 diabetes mellitus with hyperglycemia: Secondary | ICD-10-CM

## 2024-07-21 DIAGNOSIS — E785 Hyperlipidemia, unspecified: Secondary | ICD-10-CM | POA: Diagnosis not present

## 2024-07-21 DIAGNOSIS — M79604 Pain in right leg: Secondary | ICD-10-CM

## 2024-07-21 DIAGNOSIS — R202 Paresthesia of skin: Secondary | ICD-10-CM

## 2024-07-21 DIAGNOSIS — I10 Essential (primary) hypertension: Secondary | ICD-10-CM

## 2024-07-21 DIAGNOSIS — E66813 Obesity, class 3: Secondary | ICD-10-CM

## 2024-07-21 LAB — POCT URINALYSIS DIP (CLINITEK)
Bilirubin, UA: NEGATIVE
Blood, UA: NEGATIVE
Glucose, UA: NEGATIVE mg/dL
Leukocytes, UA: NEGATIVE
Nitrite, UA: NEGATIVE
POC PROTEIN,UA: 30 — AB
Spec Grav, UA: 1.02 (ref 1.010–1.025)
Urobilinogen, UA: 4 U/dL — AB
pH, UA: 7.5 (ref 5.0–8.0)

## 2024-07-21 MED ORDER — TRIAMCINOLONE ACETONIDE 40 MG/ML IJ SUSP
40.0000 mg | Freq: Once | INTRAMUSCULAR | Status: AC
  Administered 2024-07-21: 40 mg via INTRAMUSCULAR

## 2024-07-21 MED ORDER — AMLODIPINE BESYLATE 10 MG PO TABS: 10.0000 mg | ORAL_TABLET | Freq: Every day | ORAL | 1 refills | Status: AC

## 2024-07-21 MED ORDER — ATORVASTATIN CALCIUM 20 MG PO TABS: 20.0000 mg | ORAL_TABLET | Freq: Every day | ORAL | 1 refills | Status: AC

## 2024-07-21 MED ORDER — METFORMIN HCL ER 750 MG PO TB24: 750.0000 mg | ORAL_TABLET | Freq: Every day | ORAL | 1 refills | Status: AC

## 2024-07-21 MED ORDER — GABAPENTIN 100 MG PO CAPS: 100.0000 mg | ORAL_CAPSULE | Freq: Two times a day (BID) | ORAL | 1 refills | Status: AC | PRN

## 2024-07-21 MED ORDER — KETOROLAC TROMETHAMINE 30 MG/ML IJ SOLN
30.0000 mg | Freq: Once | INTRAMUSCULAR | Status: AC
  Administered 2024-07-21: 30 mg via INTRAMUSCULAR

## 2024-07-21 NOTE — Progress Notes (Signed)
 I,Jameka J Llittleton, CMA,acting as a neurosurgeon for [employer], NP.,have documented all relevant documentation on the behalf of Bruna Creighton, NP,as directed by  Bruna Creighton, NP while in the presence of Bruna Creighton, NP.  Subjective:  Patient ID: [name] , male    DOB: [birth year] , 43 y.o.   MRN: [id number]  Chief Complaint  Patient presents with   Diabetes    Patient presents today for diabetes check. Patient reports compliance with his meds. Patient denies having chest pain,sob or headaches at this time.    Hypertension   Edema    Patient reports his legs have been really hurting. He reports he is also having some leg weakness.     HPI Discussed the use of AI scribe software for clinical note transcription with the patient, who gave verbal consent to proceed.  History of Present Illness    [name] is a 43 year old male with diabetes and hypertension who presents with leg swelling and numbness.  He has persistent leg swelling and pain, primarily affecting the right leg, with numbness from the hip down to the foot on the right side. These symptoms have been present since his initial visit and have not improved despite medication for the past three months. The pain and numbness are impacting his ability to work.  He mentions a history of hitting his elbow, which he believes may have contributed to his current symptoms. Since the incident, he has experienced numbness from his neck down the left side when lifting his neck or turning it a certain way. No known neck injury is reported, but he describes a sharp pain radiating from the neck down the left side when moving his neck.  He experiences extreme fatigue and muscle spasms, particularly in his legs, which worsen with physical activity. His right leg feels 'asleep' and he experiences a burning sensation when touched. He reports difficulty walking, feeling as though he might trip due to the numbness.  He has been on gabapentin ,  which he completed without noticing any improvement in his symptoms. He reports taking medication for diabetes and hypertension and recently received a fresh refill of his prescriptions.  He has a history of diabetes with a previously high A1c level, which he has managed to lower over the years. He has not experienced numbness in his body before, despite his diabetes, and notes that his wife, who is also diabetic, has not experienced similar symptoms.  Class 3 obesity impacting health. Emphasized diet and exercise.      Past Medical History:  Diagnosis Date   Diabetes mellitus without complication (HCC)    Hypertension      Family History  Family history unknown: Yes     Current Outpatient Medications:    amLODipine  (NORVASC ) 10 MG tablet, Take 1 tablet (10 mg total) by mouth daily., Disp: 90 tablet, Rfl: 1   atorvastatin  (LIPITOR) 20 MG tablet, Take 1 tablet (20 mg total) by mouth daily., Disp: 90 tablet, Rfl: 1   gabapentin  (NEURONTIN ) 100 MG capsule, Take 1 capsule (100 mg total) by mouth 2 (two) times daily as needed., Disp: 60 capsule, Rfl: 1   metFORMIN  (GLUCOPHAGE -XR) 750 MG 24 hr tablet, Take 1 tablet (750 mg total) by mouth daily with breakfast., Disp: 90 tablet, Rfl: 1   No Known Allergies   Review of Systems  Constitutional: Negative.   Eyes: Negative.   Cardiovascular:  Negative for leg swelling.  Endocrine: Negative for polydipsia, polyphagia and polyuria.  Musculoskeletal:  Positive for arthralgias, back pain and myalgias.  Skin: Negative.   Neurological:  Positive for numbness.  Psychiatric/Behavioral: Negative.       Today's Vitals   07/21/24 1142  BP: 130/78  Pulse: 86  Temp: 98.2 F (36.8 C)  TempSrc: Oral  Weight: (!) 305 lb (138.3 kg)  Height: 6' 1 (1.854 m)  PainSc: 0-No pain   Body mass index is 40.24 kg/m.  Wt Readings from Last 3 Encounters:  07/21/24 (!) 305 lb (138.3 kg)  04/13/24 (!) 313 lb (142 kg)  03/27/24 280 lb (127 kg)    The  10-year ASCVD risk score (Arnett DK, et al., 2019) is: 10.2%   Values used to calculate the score:     Age: 68 years     Clincally relevant sex: Male     Is Non-Hispanic African American: Yes     Diabetic: Yes     Tobacco smoker: No     Systolic Blood Pressure: 130 mmHg     Is BP treated: Yes     HDL Cholesterol: 41 mg/dL     Total Cholesterol: 149 mg/dL  Objective:  Physical Exam Constitutional:      Appearance: Normal appearance.  Cardiovascular:     Rate and Rhythm: Normal rate and regular rhythm.     Pulses: Normal pulses.     Heart sounds: Normal heart sounds.  Pulmonary:     Effort: Pulmonary effort is normal.     Breath sounds: Normal breath sounds.  Abdominal:     General: Bowel sounds are normal.  Musculoskeletal:        General: Tenderness present.  Neurological:     Mental Status: He is alert.         Assessment And Plan:   Assessment & Plan Type 2 diabetes mellitus with hyperglycemia, without long-term current use of insulin (HCC) Type 2 diabetes with previous hyperglycemia, A1c at 12.  Emphasized regular eye exams. -continue Metformin  750 mg every day  Hyperlipidemia associated with type 2 diabetes mellitus (HCC) - Prescribed Lipitor for cholesterol. Essential hypertension Hypertension managed with Norvasc . - Continue Norvasc  regimen. Lumbar back pain with radiculopathy affecting right lower extremity Pain and numbness in right leg with possible lumbar radiculopathy.- Administered cortisone injection ( Toradol, Kenalog).  Morbid obesity with body mass index of 40.0-44.9 in adult [facility]) He is encouraged to strive for BMI less than 30 to decrease cardiac risk. Advised to aim for at least 150 minutes of exercise per week.   Orders Placed This Encounter  Procedures   Microalbumin / Creatinine Urine Ratio   CBC   CMP14+EGFR   Hemoglobin A1c   Lipid panel   Ambulatory referral to Ophthalmology   Ambulatory referral to Orthopedic Surgery   POCT  URINALYSIS DIP (CLINITEK)     Return for controlled DM check-4 months.  Patient was given opportunity to ask questions. Patient verbalized understanding of the plan and was able to repeat key elements of the plan. All questions were answered to their satisfaction.    I, Bruna Creighton, NP, have reviewed all documentation for this visit. The documentation on 07/26/2024 for the exam, diagnosis, procedures, and orders are all accurate and complete.   IF YOU HAVE BEEN REFERRED TO A SPECIALIST, IT MAY TAKE 1-2 WEEKS TO SCHEDULE/PROCESS THE REFERRAL. IF YOU HAVE NOT HEARD FROM US /SPECIALIST IN TWO WEEKS, PLEASE GIVE US  A CALL AT [phone].

## 2024-07-21 NOTE — Patient Instructions (Signed)

## 2024-07-22 LAB — CBC
Hematocrit: 46.3 % (ref 37.5–51.0)
Hemoglobin: 15.3 g/dL (ref 13.0–17.7)
MCH: 30.4 pg (ref 26.6–33.0)
MCHC: 33 g/dL (ref 31.5–35.7)
MCV: 92 fL (ref 79–97)
Platelets: 254 x10E3/uL (ref 150–450)
RBC: 5.04 x10E6/uL (ref 4.14–5.80)
RDW: 12.5 % (ref 11.6–15.4)
WBC: 3.9 x10E3/uL (ref 3.4–10.8)

## 2024-07-22 LAB — LIPID PANEL
Chol/HDL Ratio: 3.6 ratio (ref 0.0–5.0)
Cholesterol, Total: 149 mg/dL (ref 100–199)
HDL: 41 mg/dL (ref >39)
LDL Chol Calc (NIH): 80 mg/dL (ref 0–99)
Triglycerides: 160 mg/dL — ABNORMAL HIGH (ref 0–149)
VLDL Cholesterol Cal: 28 mg/dL (ref 5–40)

## 2024-07-22 LAB — CMP14+EGFR
ALT: 28 IU/L (ref 0–44)
AST: 30 IU/L (ref 0–40)
Albumin: 4.7 g/dL (ref 4.1–5.1)
Alkaline Phosphatase: 83 IU/L (ref 47–123)
BUN/Creatinine Ratio: 17 (ref 9–20)
BUN: 18 mg/dL (ref 6–24)
Bilirubin Total: 0.6 mg/dL (ref 0.0–1.2)
CO2: 27 mmol/L (ref 20–29)
Calcium: 10.1 mg/dL (ref 8.7–10.2)
Chloride: 102 mmol/L (ref 96–106)
Creatinine, Ser: 1.09 mg/dL (ref 0.76–1.27)
Globulin, Total: 2.5 g/dL (ref 1.5–4.5)
Glucose: 112 mg/dL — ABNORMAL HIGH (ref 70–99)
Potassium: 4.2 mmol/L (ref 3.5–5.2)
Sodium: 140 mmol/L (ref 134–144)
Total Protein: 7.2 g/dL (ref 6.0–8.5)
eGFR: 87 mL/min/1.73 (ref >59)

## 2024-07-22 LAB — HEMOGLOBIN A1C
Est. average glucose Bld gHb Est-mCnc: 131 mg/dL
Hgb A1c MFr Bld: 6.2 % — ABNORMAL HIGH (ref 4.8–5.6)

## 2024-07-22 LAB — MICROALBUMIN / CREATININE URINE RATIO
Creatinine, Urine: 252.2 mg/dL
Microalb/Creat Ratio: 2 mg/g{creat} (ref 0–29)
Microalbumin, Urine: 6.1 ug/mL

## 2024-07-24 ENCOUNTER — Encounter: Payer: Self-pay | Admitting: Family Medicine

## 2024-07-26 ENCOUNTER — Ambulatory Visit: Payer: Self-pay | Admitting: Family Medicine

## 2024-07-26 NOTE — Assessment & Plan Note (Signed)
 Pain and numbness in right leg with possible lumbar radiculopathy.- Administered cortisone injection ( Toradol, Kenalog).

## 2024-07-26 NOTE — Assessment & Plan Note (Signed)
 He is encouraged to strive for BMI less than 30 to decrease cardiac risk. Advised to aim for at least 150 minutes of exercise per week.

## 2024-07-26 NOTE — Assessment & Plan Note (Signed)
 Hypertension managed with Norvasc . - Continue Norvasc  regimen.

## 2024-07-26 NOTE — Progress Notes (Signed)
 Your A1c has dropped to 6.2, goal is less than 7. Keep up the good work.Your triglycides(part of your cholesterol is elevated) eat a low carb diet and continue taking your meds.  Your electrolytes and kidney function are normal.   Thank you!

## 2024-07-26 NOTE — Assessment & Plan Note (Signed)
-   Prescribed Lipitor for cholesterol.

## 2024-07-26 NOTE — Assessment & Plan Note (Signed)
 Type 2 diabetes with previous hyperglycemia, A1c at 12.  Emphasized regular eye exams. -continue Metformin  750 mg every day

## 2024-08-01 ENCOUNTER — Encounter: Payer: Self-pay | Admitting: Physical Medicine and Rehabilitation

## 2024-08-01 ENCOUNTER — Other Ambulatory Visit: Payer: Self-pay

## 2024-08-01 ENCOUNTER — Ambulatory Visit (INDEPENDENT_AMBULATORY_CARE_PROVIDER_SITE_OTHER): Admitting: Physical Medicine and Rehabilitation

## 2024-08-01 DIAGNOSIS — R202 Paresthesia of skin: Secondary | ICD-10-CM

## 2024-08-01 DIAGNOSIS — M5441 Lumbago with sciatica, right side: Secondary | ICD-10-CM | POA: Diagnosis not present

## 2024-08-01 DIAGNOSIS — M5416 Radiculopathy, lumbar region: Secondary | ICD-10-CM

## 2024-08-01 DIAGNOSIS — G8929 Other chronic pain: Secondary | ICD-10-CM

## 2024-08-01 NOTE — Progress Notes (Signed)
 [name] - 43 y.o. male MRN [id number]  Date of birth: Dec 10, 1980  Office Visit Note: Visit Date: 08/01/2024 PCP: Georgina Speaks, FNP Referred by: Petrina Pries, NP  Subjective: Chief Complaint  Patient presents with   Lower Back - Pain   HPI: [name] is a 43 y.o. male who comes in today per the request of Pries Petrina, NP for evaluation of chronic, worsening and severe right sided lower back pain radiating to buttock and posterolateral thigh down to knee. Also reports chronic numbness/tingling to right leg. Pain ongoing for several months. His symptoms worsen with activity and movement. 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 He describes pain as sore and stiff sensation, currently rates as 8 out of 10. Some relief of pain with home exercise regimen, rest and use of medications. He does take Gabapentin  200 mg BID to manage neuropathy like symptoms. No history of formal physical therapy for lower back issues. No prior imaging of lumbar spine. No history of lumbar surgery/injections. He was referred to neurology for decreased sensation of right leg in July of this year, however had to re-schedule and was not evaluated. Most recent A1C was 6.2 on 07/21/2024. His blood sugar has been more controlled over the last several years. Patient denies focal weakness. No recent trauma or falls.      Review of Systems  Musculoskeletal:  Positive for back pain.  Neurological:  Positive for tingling and sensory change. Negative for weakness.  All other systems reviewed and are negative.  Otherwise per HPI.  Assessment & Plan: Visit Diagnoses:    ICD-10-CM   1. Chronic right-sided low back pain with right-sided sciatica  M54.41 MR LUMBAR SPINE WO CONTRAST   G89.29     2. Lumbar radiculopathy  M54.16 XR Lumbar Spine 2-3 Views    MR LUMBAR SPINE WO CONTRAST    3. Paresthesia of skin  R20.2        Plan: Findings:  Chronic, worsening and severe  right sided lower back pain radiating to buttock and posterolateral thigh down to knee. Patients clinical presentation and exam are consistent with lumbar radiculopathy, more of L5 and S1 nerve pattern. I obtained lumbar radiographs in the office today that show lower lumbar facet arthropathy, disc height loss and biforaminal narrowing at L4-L5. We discussed treatment plan in detail today. Next step is to place order for lumbar MRI imaging. Depending on results of MRI imaging we discussed possibility of performing lumbar epidural steroid injections. He does have 4 out of 5 weakness to right hip flexion today. Decreased sensation to right lower extremity compared to left. Chronic paresthesias could be more of a diabetic neuropathy. I encouraged him to speak with his PCP regarding referral to neurology.  He can also speak with PCP about increasing dose of Gabapentin , he has room to increase safely if tolerating well. We will see him back in office for lumbar MRI review. He did inquire about work restrictions, he is currently out on [employer].     Meds & Orders: No orders of the defined types were placed in this encounter.   Orders Placed This Encounter  Procedures   XR Lumbar Spine 2-3 Views   MR LUMBAR SPINE WO CONTRAST    Follow-up: Return for Lumbar MRI review.   Procedures: No procedures performed      Clinical History: No specialty comments available.   He reports that he has never smoked. He has never used smokeless  tobacco.  Recent Labs    04/13/24 1236 07/21/24 1217  HGBA1C 7.3* 6.2*    Objective:  VS:  HT:    WT:   BMI:     BP:   HR: bpm  TEMP: ( )  RESP:  Physical Exam Vitals and nursing note reviewed.  HENT:     Head: Normocephalic and atraumatic.     Right Ear: External ear normal.     Left Ear: External ear normal.     Nose: Nose normal.     Mouth/Throat:     Mouth: Mucous membranes are moist.  Eyes:     Extraocular Movements: Extraocular movements intact.   Cardiovascular:     Rate and Rhythm: Normal rate.     Pulses: Normal pulses.  Pulmonary:     Effort: Pulmonary effort is normal.  Abdominal:     General: Abdomen is flat. There is no distension.  Musculoskeletal:        General: Tenderness present.     Cervical back: Normal range of motion.     Comments: Patient rises from seated position to standing without difficulty. Good lumbar range of motion. No pain noted with facet loading. 5/5 strength noted with left hip flexion, knee flexion/extension, ankle dorsiflexion/plantarflexion and EHL. 4/5 strength noted with right hip flexion, 5/5 strength with knee flexion/extension, ankle dorsiflexion/plantarflexion and EHL. No clonus noted bilaterally. No pain upon palpation of greater trochanters. No pain with internal/external rotation of bilateral hips. Decreased sensation to right lower extremity compared to left.  Negative slump test bilaterally. Ambulates without aid, gait steady.     Skin:    General: Skin is warm and dry.     Capillary Refill: Capillary refill takes less than 2 seconds.  Neurological:     Mental Status: He is alert and oriented to person, place, and time.  Psychiatric:        Mood and Affect: Mood normal.        Behavior: Behavior normal.     Ortho Exam  Imaging: XR Lumbar Spine 2-3 Views Result Date: 08/01/2024 AP and lateral radiographs of lumbar spine shows lower lumbar degenerative changes, most significant at L4-L5 where there is disc height loss and biforaminal narrowing. There is lower lumbar facet arthropathy. No fractures.    Past Medical/Family/Surgical/Social History: Medications & Allergies reviewed per EMR, new medications updated. Patient Active Problem List   Diagnosis Date Noted   Morbid obesity with body mass index of 40.0-44.9 in adult [facility]) 07/26/2024   Type 2 diabetes mellitus with hyperglycemia, without long-term current use of insulin (HCC) 04/14/2024   Hyperlipidemia associated with type 2  diabetes mellitus (HCC) 04/14/2024   Class 3 severe obesity due to excess calories with serious comorbidity and body mass index (BMI) of 40.0 to 44.9 in adult (HCC) 04/14/2024   Numbness and tingling of right upper and lower extremity 04/13/2024   Mixed hyperlipidemia 04/13/2024   Primary hypertension 04/13/2024   Lumbar back pain with radiculopathy affecting right lower extremity 04/13/2024   Abnormal glucose 04/13/2024   Left elbow pain 04/13/2024   Essential hypertension 10/17/2019   Fatigue 10/17/2019   Chapped lips 10/17/2019   Past Medical History:  Diagnosis Date   Diabetes mellitus without complication (HCC)    Hypertension    Family History  Family history unknown: Yes   No past surgical history on file. Social History   Occupational History   Not on file  Tobacco Use   Smoking status: Never   Smokeless tobacco:  Never  Vaping Use   Vaping status: Never Used  Substance and Sexual Activity   Alcohol use: Yes   Drug use: Yes    Types: Marijuana   Sexual activity: Not Currently

## 2024-08-01 NOTE — Progress Notes (Signed)
 Pain Scale   Average Pain 5 Patient advising he has lower back pain radiating to bilateral hips and right leg. Patient advising hs pain comes and goes depending on activity.        +Driver, -BT, -Dye Allergies.

## 2024-08-09 ENCOUNTER — Ambulatory Visit: Admitting: Family Medicine

## 2024-08-10 ENCOUNTER — Ambulatory Visit: Admitting: Family Medicine

## 2024-08-10 ENCOUNTER — Encounter: Payer: Self-pay | Admitting: Family Medicine

## 2024-08-10 VITALS — BP 140/90 | HR 73 | Temp 98.3°F | Ht 73.0 in | Wt 302.0 lb

## 2024-08-10 DIAGNOSIS — J45909 Unspecified asthma, uncomplicated: Secondary | ICD-10-CM

## 2024-08-10 DIAGNOSIS — M5416 Radiculopathy, lumbar region: Secondary | ICD-10-CM

## 2024-08-10 MED ORDER — GABAPENTIN 300 MG PO CAPS: 300.0000 mg | ORAL_CAPSULE | Freq: Every day | ORAL | 1 refills | Status: AC

## 2024-08-10 MED ORDER — AIRSUPRA 90-80 MCG/ACT IN AERO: 2.0000 | INHALATION_SPRAY | Freq: Four times a day (QID) | RESPIRATORY_TRACT | 2 refills | Status: AC | PRN

## 2024-08-10 NOTE — Progress Notes (Unsigned)
 I,[name], CMA,acting as a neurosurgeon for [employer], NP.,have documented all relevant documentation on the behalf of Jacob Creighton, NP,as directed by  Jacob Creighton, NP while in the presence of Jacob Creighton, NP.  Subjective:  Patient ID: [name] , male    DOB: Jan 31, 1981 , 43 y.o.   MRN: [id number]  Chief Complaint  Patient presents with   Asthma    Patient presents today for a asthma flare up. He reports he has been having sob. He is unsure if it is due to the problems he has going on with his legs or not. Patient would like a refill on his inhaler.    HPI Discussed the use of AI scribe software for clinical note transcription with the patient, who gave verbal consent to proceed.  History of Present Illness      Jaion Lagrange is a 43 year old male with asthma who presents with shortness of breath.  He has been experiencing shortness of breath over the past two months, particularly noticeable during activities such as walking to the mailbox or carrying his daughter. He describes the sensation as 'breathing hard' and notes that he has not had to breathe hard before. Additionally, he mentions a decrease in cough strength, requiring more effort to cough effectively.  He has a history of asthma and previously used an inhaler as needed, but it has been approximately ten years since he last used it. His asthma was previously triggered when his A1c was elevated and his weight was around 350 pounds. He has since lost weight from 400 pounds to his current weight.  He is currently taking gabapentin , blood pressure medication, and high cholesterol medication. He reports no significant side effects from gabapentin , although he occasionally feels 'ice cold' in his arms. He does not experience drowsiness from the medication and takes it alongside his other medications.        Past Medical History:  Diagnosis Date   Diabetes mellitus without complication (HCC)    Hypertension       Family History  Family history unknown: Yes     Current Outpatient Medications:    Albuterol-Budesonide (AIRSUPRA ) 90-80 MCG/ACT AERO, Inhale 2 puffs into the lungs every 6 (six) hours as needed., Disp: 32.1 g, Rfl: 2   amLODipine  (NORVASC ) 10 MG tablet, Take 1 tablet (10 mg total) by mouth daily., Disp: 90 tablet, Rfl: 1   atorvastatin  (LIPITOR) 20 MG tablet, Take 1 tablet (20 mg total) by mouth daily., Disp: 90 tablet, Rfl: 1   gabapentin  (NEURONTIN ) 100 MG capsule, Take 1 capsule (100 mg total) by mouth 2 (two) times daily as needed., Disp: 60 capsule, Rfl: 1   gabapentin  (NEURONTIN ) 300 MG capsule, Take 1 capsule (300 mg total) by mouth at bedtime., Disp: 90 capsule, Rfl: 1   metFORMIN  (GLUCOPHAGE -XR) 750 MG 24 hr tablet, Take 1 tablet (750 mg total) by mouth daily with breakfast., Disp: 90 tablet, Rfl: 1   No Known Allergies   Review of Systems  Respiratory:  Positive for shortness of breath and wheezing.   Musculoskeletal:  Positive for arthralgias, back pain and gait problem.  Psychiatric/Behavioral: Negative.       Today's Vitals   08/10/24 0935 08/10/24 1018  BP: (!) 140/78 (!) 140/90  Pulse: 73   Temp: 98.3 F (36.8 C)   TempSrc: Oral   Weight: (!) 302 lb (137 kg)   Height: 6' 1 (1.854 m)   PainSc: 0-No pain    Body mass  index is 39.84 kg/m.  Wt Readings from Last 3 Encounters:  08/10/24 (!) 302 lb (137 kg)  07/21/24 (!) 305 lb (138.3 kg)  04/13/24 (!) 313 lb (142 kg)    The 10-year ASCVD risk score (Arnett DK, et al., 2019) is: 11.6%   Values used to calculate the score:     Age: 74 years     Clinically relevant sex: Male     Is Non-Hispanic African American: Yes     Diabetic: Yes     Tobacco smoker: No     Systolic Blood Pressure: 140 mmHg     Is BP treated: Yes     HDL Cholesterol: 41 mg/dL     Total Cholesterol: 149 mg/dL  Objective:  Physical Exam Constitutional:      Appearance: Normal appearance.  Cardiovascular:     Rate and Rhythm:  Normal rate and regular rhythm.     Pulses: Normal pulses.     Heart sounds: Normal heart sounds.  Pulmonary:     Effort: Pulmonary effort is normal.     Breath sounds: Normal breath sounds.  Abdominal:     General: Bowel sounds are normal.  Skin:    General: Skin is warm.  Neurological:     Mental Status: He is alert and oriented to person, place, and time.         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 Symptoms include cold sensations in arms. Gabapentin  dose adjustment considered. - Increased gabapentin  to 300 mg nightly. - Advised starting with two tablets, increase to three if tolerated. - Scheduled MRI for further evaluation. - Plan for potential steroid injections post-MRI. Essential hypertension BP not controlled today, admits poor adherence to low salt diet. He just had sausage and fries before appointment. Low salt diet advised BP check in 2 weeks. Morbid obesity with body mass index of 40.0-44.9 in adult [facility]) He is encouraged to strive for BMI less than 30 to decrease cardiac risk. Advised to aim for at least 150 minutes of exercise per week.  Moderate persistent asthma without complication Recent exacerbation with dyspnea on exertion. No recent inhaler use. No infection or fever. Prescribe Airsupra  inhaler Q4-6 hours PRN    Return if symptoms worsen or fail to improve, for 2 wk NV BPC.  Patient was given opportunity to ask questions. Patient verbalized understanding of the plan and was able to repeat key elements of the plan. All questions were answered to their satisfaction.    I, Jacob Creighton, NP, have reviewed all documentation for this visit. The documentation on 08/20/2024 for the exam, diagnosis, procedures, and orders are all accurate and complete.    IF YOU HAVE BEEN REFERRED TO A SPECIALIST, IT MAY TAKE 1-2 WEEKS TO  SCHEDULE/PROCESS THE REFERRAL. IF YOU HAVE NOT HEARD FROM US /SPECIALIST IN TWO WEEKS, PLEASE GIVE US  A CALL AT [phone].

## 2024-08-21 NOTE — Assessment & Plan Note (Signed)
 He is encouraged to strive for BMI less than 30 to decrease cardiac risk. Advised to aim for at least 150 minutes of exercise per week.

## 2024-08-21 NOTE — Assessment & Plan Note (Signed)
 BP not controlled today, admits poor adherence to low salt diet. He just had sausage and fries before appointment. Low salt diet advised BP check in 2 weeks.

## 2024-08-21 NOTE — Assessment & Plan Note (Signed)
 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 Symptoms include cold sensations in arms. Gabapentin  dose adjustment considered. - Increased gabapentin  to 300 mg nightly. - Advised starting with two tablets, increase to three if tolerated. - Scheduled MRI for further evaluation. - Plan for potential steroid injections post-MRI.

## 2024-08-25 ENCOUNTER — Inpatient Hospital Stay
Admission: RE | Admit: 2024-08-25 | Discharge: 2024-08-25 | Attending: Physical Medicine and Rehabilitation | Admitting: Physical Medicine and Rehabilitation

## 2024-08-25 ENCOUNTER — Encounter: Payer: Self-pay | Admitting: Family Medicine

## 2024-08-25 ENCOUNTER — Ambulatory Visit

## 2024-08-25 DIAGNOSIS — G8929 Other chronic pain: Secondary | ICD-10-CM

## 2024-08-25 DIAGNOSIS — M5416 Radiculopathy, lumbar region: Secondary | ICD-10-CM

## 2024-08-28 ENCOUNTER — Ambulatory Visit: Payer: Self-pay

## 2024-09-04 ENCOUNTER — Telehealth: Payer: Self-pay

## 2024-09-04 NOTE — Telephone Encounter (Signed)
 LM yo cal and schedule MRI review with Megan

## 2024-09-05 ENCOUNTER — Ambulatory Visit: Payer: Self-pay

## 2024-09-05 ENCOUNTER — Telehealth: Payer: Self-pay | Admitting: Physical Medicine and Rehabilitation

## 2024-09-05 ENCOUNTER — Telehealth: Payer: Self-pay

## 2024-09-05 NOTE — Telephone Encounter (Signed)
 Patient calling in with lower back pain since June. States the pain is severe 10/10 first thing when he wakes up and then decreases to 5/10. Complaining of right foot drop heavy foot drop and hard time going up or down stairs. He states his right side if numb from his right foot to his right mid ribs. He also states he has bilateral foot swelling, right groin numbness and dizzy/ off balance and states it is when he tries to walk and look at his phone at the same time. RN advised ED for immediate care for worsening symptoms. Patient states he would just like to be seen with his PCP for sooner follow up. Called CAL and received message clinic access is not available. '   I returned patient's call after speaking with provider and advised him to get in touch with his orthopaedic and advise them of what is going on so they can get him in sooner and also discuss his MRI results.YL,RMA

## 2024-09-05 NOTE — Telephone Encounter (Signed)
 FYI Only or Action Required?: FYI only for provider: ED advised.and refused.  Patient was last seen in primary care on 08/10/2024 by Petrina Pries, NP.  Called Nurse Triage reporting Gait Problem and Back Pain.  Symptoms began several months ago.  Interventions attempted: Prescription medications: gabapentin .  Symptoms are: gradually worsening.           Triage Disposition: Go to ED Now (Notify PCP)  Patient/caregiver understands and will follow disposition?: No, wishes to speak with PCPCopied from CRM #[id number]. Topic: Clinical - Red Word Triage >> Sep 05, 2024 11:22 AM Hadassah PARAS wrote: Red Word that prompted transfer to Nurse Triage: Balance is off, numbness in feet, neck, left arm. Has compressed nerves on lower back. Have not worked in 2.5 months. Reason for Disposition  Numbness in groin or rectal area (i.e., loss of sensation)  Answer Assessment - Initial Assessment Questions Patient calling in with lower back pain since June. States the pain is severe 10/10 first thing when he wakes up and then decreases to 5/10. Complaining of right foot drop heavy foot drop and hard time going up or down stairs. He states his right side if numb from his right foot to his right mid ribs. He also states he has bilateral foot swelling, right groin numbness and dizzy/ off balance and states it is when he tries to walk and look at his phone at the same time. RN advised ED for immediate care for worsening symptoms. Patient states he would just like to be seen with his PCP for sooner follow up. Called CAL and received message clinic access is not available.  Protocols used: Back Pain-A-AH

## 2024-09-05 NOTE — Telephone Encounter (Signed)
 Pt called asking for an earlier apt because the pain in really bad in his legs and back as well as a really heavy foot drop, bad muscle spasms. Call back number is [phone]

## 2024-09-13 ENCOUNTER — Encounter: Payer: Self-pay | Admitting: Family Medicine

## 2024-09-14 ENCOUNTER — Ambulatory Visit: Admitting: Physical Medicine and Rehabilitation

## 2024-09-14 DIAGNOSIS — M5441 Lumbago with sciatica, right side: Secondary | ICD-10-CM

## 2024-09-14 DIAGNOSIS — M5416 Radiculopathy, lumbar region: Secondary | ICD-10-CM | POA: Diagnosis not present

## 2024-09-14 DIAGNOSIS — G8929 Other chronic pain: Secondary | ICD-10-CM | POA: Diagnosis not present

## 2024-09-14 DIAGNOSIS — R202 Paresthesia of skin: Secondary | ICD-10-CM | POA: Diagnosis not present

## 2024-09-14 NOTE — Progress Notes (Unsigned)
 "  [name] - 44 y.o. male MRN [id number]  Date of birth: [birth year]  Office Visit Note: Visit Date: 09/14/2024 PCP: Georgina Speaks, FNP Referred by: Georgina Speaks, FNP  Subjective: Chief Complaint  Patient presents with   Lower Back - Pain   HPI: [name] is a 44 y.o. male who comes in today for evaluation of chronic, worsening and severe bilateral lower back pain radiating to right buttock and posterolateral thigh down to foot. Also reports chronic numbness/tingling to right leg. He is here today for lumbar MRI review. Pain ongoing for several months. He reports intermittent weakness to right leg and also reports issues with dragging right foot when he wakes up in the morning. He also complains of muscle cramping all over his body. His symptoms worsen with activity and movement. 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 He describes pain as sore and stiff sensation, currently rates as 9 out of 10. Some relief of pain with home exercise regimen, rest and use of medications. He does take Gabapentin  200 mg BID to manage neuropathy like symptoms. No history of formal physical therapy for lower back issues. Recent lumbar MRI imaging shows central to right subarticular disc protrusion with inferior migration at L5-S1, closely approximating and potentially irritating the descending right S1 nerve root. Severe bilateral L5 foraminal stenosis related to degenerative disc disease and facet hypertrophy. There are degenerative changes including moderate facet arthropathy at L4-L5, also lateral recess and foraminal narrowing at this level. No high grade spinal canal stenosis. Patient denies recent trauma or falls.   He is inquiring about short term disability today.         Review of Systems  Musculoskeletal:  Positive for back pain.  Neurological:  Positive for tingling, sensory change and weakness.  All other systems reviewed and are negative.   Otherwise per HPI.  Assessment & Plan: Visit Diagnoses:    ICD-10-CM   1. Chronic bilateral low back pain with right-sided sciatica  G89.29    M54.41     2. Lumbar radiculopathy  M54.16     3. Paresthesia of skin  R20.2        Plan: Findings:  Chronic, worsening and severe bilateral lower back pain radiating to right buttock and down right posterolateral leg to foot. Patient continues to have severe pain despite good conservative therapies such as home exercise regimen, rest and use of medications. I discussed recent lumbar MRI with him today using imaging and spine model. There is central to right subarticular disc herniation at the level of L5-S1, potentially irritating the S1 nerve root. Severe bilateral L5 foraminal narrowing. There are also degenerative changes and lateral recess/foraminal narrowing at L4-L5. We discussed treatment plan in detail today. Next step is to perform diagnostic and hopefully therapeutic right L5-S1 interlaminar epidural steroid injection under fluoroscopic guidance. He is not currently taking anticoagulant medications. I do think his symptoms fit with disc herniation at L5-S1. I explained to him that this disc herniation will likely re-absorb over time. He does not have weakness on exam today. No foot drop noted. Positive slump test on the right. It is painful for him to bear weight on right leg, antalgic gait noted. We will see him back for injection.     Meds & Orders: No orders of the defined types were placed in this encounter.  No orders of the defined types were placed in this encounter.   Follow-up: No follow-ups on file.  Procedures: No procedures performed      Clinical History: CLINICAL DATA:  Initial evaluation for lower back pain with bilateral leg weakness for several years.   EXAM: MRI LUMBAR SPINE WITHOUT CONTRAST   TECHNIQUE: Multiplanar, multisequence MR imaging of the lumbar spine was performed. No intravenous contrast was  administered.   COMPARISON:  Radiograph from 08/01/2024.   FINDINGS: Segmentation: Standard. Lowest well-formed disc space labeled the L5-S1 level.   Alignment: Mild exaggeration of the normal lumbar lordosis. No listhesis.   Vertebrae: Vertebral body height maintained without acute or chronic fracture. Bone marrow signal intensity overall within normal limits. No worrisome osseous lesions. Degenerative reactive endplate change with marrow edema present about the L4-5 interspace. Mild reactive marrow edema present about the left L5-S1 facet.   Conus medullaris and cauda equina: Conus extends to the L1 level. Conus and cauda equina appear normal.   Paraspinal and other soft tissues: Paraspinous soft tissues within normal limits.   Disc levels:   T11-12: Seen only on sagittal projection. Mild disc bulge. Mild bilateral facet hypertrophy. No stenosis.   T12-L1 normal interspace.  Mild facet spurring.  No stenosis.   L1-2: Normal interspace. Mild left greater than right facet hypertrophy. No stenosis.   L2-3: Disc desiccation with circumferential disc bulge. Mild bilateral facet hypertrophy. No spinal stenosis. Mild left L2 foraminal stenosis. Right neural foramina remains patent.   L3-4: Mild intervertebral disc space narrowing with disc desiccation diffuse disc bulge. Superimposed small right foraminal to extraforaminal disc protrusion contacts the exiting right L3 nerve root (series 307, image 23). Mild bilateral facet hypertrophy. No spinal stenosis. Mild bilateral L3 foraminal narrowing.   L4-5: Moderate degenerative intervertebral disc space narrowing with disc desiccation diffuse disc bulge. Reactive endplate change with marginal endplate osteophytic spurring and mild marrow edema. Moderate bilateral facet hypertrophy, greater on the right. No significant spinal stenosis. Moderate to severe right with moderate left L4 foraminal stenosis.   L5-S1: Degenerative  intervertebral disc space narrowing with disc desiccation diffuse disc bulge. Reactive endplate change with marginal endplate osteophytic spurring. Superimposed central to right subarticular disc protrusion with inferior migration (series 306, images 34, 35). Protruding disc closely approximates the descending right S1 nerve root as it courses of the right lateral recess. Moderate right worse than left facet arthrosis. Epidural lipomatosis. No more than mild narrowing of the right lateral recess. Central canal remains patent. Severe bilateral L5 foraminal stenosis.   IMPRESSION: 1. Central to right subarticular disc protrusion with inferior migration at L5-S1, closely approximating and potentially irritating the descending right S1 nerve root. 2. Severe bilateral L5 foraminal stenosis related to degenerative disc disease and facet hypertrophy. 3. Degenerative spondylosis at L4-5 with resultant moderate to severe right and moderate left L4 foraminal stenosis. 4. Small right foraminal to extraforaminal disc protrusion at L3-4, potentially affecting the exiting right L3 nerve root.\     Electronically Signed   By: Morene Hoard M.D.   On: 08/27/2024 22:57   He reports that he has never smoked. He has never used smokeless tobacco.  Recent Labs    04/13/24 1236 07/21/24 1217  HGBA1C 7.3* 6.2*    Objective:  VS:  HT:    WT:   BMI:     BP:   HR: bpm  TEMP: ( )  RESP:  Physical Exam Vitals and nursing note reviewed.  HENT:     Head: Normocephalic and atraumatic.     Right Ear: External ear normal.     Left Ear:  External ear normal.     Nose: Nose normal.     Mouth/Throat:     Mouth: Mucous membranes are moist.  Eyes:     Extraocular Movements: Extraocular movements intact.  Cardiovascular:     Rate and Rhythm: Normal rate.     Pulses: Normal pulses.  Pulmonary:     Effort: Pulmonary effort is normal.  Abdominal:     General: Abdomen is flat. There is no  distension.  Musculoskeletal:        General: Tenderness present.     Cervical back: Normal range of motion.     Comments: Patient rises from seated position to standing without difficulty. Good lumbar range of motion. No pain noted with facet loading. 5/5 strength noted with bilateral hip flexion, knee flexion/extension, ankle dorsiflexion/plantarflexion and EHL. No clonus noted bilaterally. No pain upon palpation of greater trochanters. No pain with internal/external rotation of bilateral hips. Decreased sensation to right leg compared to left. Positive slump test on the right. Antalgic gait.     Skin:    General: Skin is warm and dry.     Capillary Refill: Capillary refill takes less than 2 seconds.  Neurological:     Mental Status: He is alert and oriented to person, place, and time.     Gait: Gait abnormal.  Psychiatric:        Mood and Affect: Mood normal.        Behavior: Behavior normal.     Ortho Exam  Imaging: No results found.  Past Medical/Family/Surgical/Social History: Medications & Allergies reviewed per EMR, new medications updated. Patient Active Problem List   Diagnosis Date Noted   Morbid obesity with body mass index of 40.0-44.9 in adult [facility]) 07/26/2024   Type 2 diabetes mellitus with hyperglycemia, without long-term current use of insulin (HCC) 04/14/2024   Hyperlipidemia associated with type 2 diabetes mellitus (HCC) 04/14/2024   Class 3 severe obesity due to excess calories with serious comorbidity and body mass index (BMI) of 40.0 to 44.9 in adult (HCC) 04/14/2024   Numbness and tingling of right upper and lower extremity 04/13/2024   Mixed hyperlipidemia 04/13/2024   Primary hypertension 04/13/2024   Lumbar back pain with radiculopathy affecting right lower extremity 04/13/2024   Abnormal glucose 04/13/2024   Left elbow pain 04/13/2024   Essential hypertension 10/17/2019   Fatigue 10/17/2019   Chapped lips 10/17/2019   Past Medical History:   Diagnosis Date   Diabetes mellitus without complication (HCC)    Hypertension    Family History  Family history unknown: Yes   No past surgical history on file. Social History   Occupational History   Not on file  Tobacco Use   Smoking status: Never   Smokeless tobacco: Never  Vaping Use   Vaping status: Never Used  Substance and Sexual Activity   Alcohol use: Yes   Drug use: Yes    Types: Marijuana   Sexual activity: Not Currently   "

## 2024-09-14 NOTE — Progress Notes (Signed)
 Pain Scale   Average Pain 10 Patient advising he has chronic lower back pain that is constant patient her for MRI review.        +Driver, -BT, -Dye Allergies.

## 2024-09-15 ENCOUNTER — Encounter: Payer: Self-pay | Admitting: Physical Medicine and Rehabilitation

## 2024-09-19 ENCOUNTER — Telehealth: Payer: Self-pay | Admitting: Physical Medicine and Rehabilitation

## 2024-09-19 NOTE — Telephone Encounter (Signed)
 Completed form provided by patient completed for intermittent leave. Faxed [employer] [phone]

## 2024-09-19 NOTE — Telephone Encounter (Signed)
 I have received paperwork for FMLA. Please clarify if you've approved continuous or intermittent leave. Please advise duration. Thank you!

## 2024-10-05 ENCOUNTER — Ambulatory Visit: Admitting: Physical Medicine and Rehabilitation

## 2024-10-05 ENCOUNTER — Other Ambulatory Visit: Payer: Self-pay

## 2024-10-05 DIAGNOSIS — M5416 Radiculopathy, lumbar region: Secondary | ICD-10-CM | POA: Diagnosis not present

## 2024-10-05 MED ORDER — METHYLPREDNISOLONE ACETATE 40 MG/ML IJ SUSP
40.0000 mg | Freq: Once | INTRAMUSCULAR | Status: AC
  Administered 2024-10-05: 40 mg

## 2024-10-05 NOTE — Progress Notes (Signed)
 Pain Scale   Average Pain 10  Pain goes down right leg, hurts when walking.       +Driver, -BT, -Dye Allergies.

## 2024-10-09 NOTE — Procedures (Signed)
 Lumbar Epidural Steroid Injection - Interlaminar Approach with Fluoroscopic Guidance  Patient: [name]      Date of Birth: [birth year] MRN: [id number] PCP: Georgina Speaks, FNP      Visit Date: 10/05/2024   Universal Protocol:     Consent Given By: the patient  Position: PRONE  Additional Comments: Vital signs were monitored before and after the procedure. Patient was prepped and draped in the usual sterile fashion. The correct patient, procedure, and site was verified.   Injection Procedure Details:   Procedure diagnoses: Lumbar radiculopathy [M54.16]   Meds Administered:  Meds ordered this encounter  Medications   methylPREDNISolone  acetate (DEPO-MEDROL ) injection 40 mg     Laterality: Right  Location/Site:  L5-S1  Needle: 4.5 in., 20 ga. Tuohy  Needle Placement: Paramedian epidural  Findings:   -Comments: Excellent flow of contrast into the epidural space.  Procedure Details: Using a paramedian approach from the side mentioned above, the region overlying the inferior lamina was localized under fluoroscopic visualization and the soft tissues overlying this structure were infiltrated with 4 ml. of 1% Lidocaine  without Epinephrine. The Tuohy needle was inserted into the epidural space using a paramedian approach.   The epidural space was localized using loss of resistance along with counter oblique bi-planar fluoroscopic views.  After negative aspirate for air, blood, and CSF, a 2 ml. volume of Isovue-250 was injected into the epidural space and the flow of contrast was observed. Radiographs were obtained for documentation purposes.    The injectate was administered into the level noted above.   Additional Comments:  The patient tolerated the procedure well Dressing: 2 x 2 sterile gauze and Band-Aid    Post-procedure details: Patient was observed during the procedure. Post-procedure instructions were reviewed.  Patient left the clinic in stable condition.

## 2024-11-17 ENCOUNTER — Ambulatory Visit: Admitting: Nurse Practitioner
